# Patient Record
Sex: Female | Born: 1975 | Race: White | Hispanic: No | Marital: Married | Smoking: Former smoker
Health system: Southern US, Community
[De-identification: ages and names within clinical notes are randomized; demographics above are authoritative.]

## PROBLEM LIST (undated history)

## (undated) DIAGNOSIS — K08109 Complete loss of teeth, unspecified cause, unspecified class: Secondary | ICD-10-CM

## (undated) DIAGNOSIS — M25562 Pain in left knee: Secondary | ICD-10-CM

## (undated) DIAGNOSIS — Z862 Personal history of diseases of the blood and blood-forming organs and certain disorders involving the immune mechanism: Secondary | ICD-10-CM

## (undated) DIAGNOSIS — N926 Irregular menstruation, unspecified: Secondary | ICD-10-CM

## (undated) DIAGNOSIS — G8929 Other chronic pain: Secondary | ICD-10-CM

## (undated) DIAGNOSIS — R3915 Urgency of urination: Secondary | ICD-10-CM

## (undated) DIAGNOSIS — Z8709 Personal history of other diseases of the respiratory system: Secondary | ICD-10-CM

## (undated) DIAGNOSIS — Z972 Presence of dental prosthetic device (complete) (partial): Secondary | ICD-10-CM

## (undated) DIAGNOSIS — N946 Dysmenorrhea, unspecified: Secondary | ICD-10-CM

## (undated) DIAGNOSIS — N12 Tubulo-interstitial nephritis, not specified as acute or chronic: Secondary | ICD-10-CM

## (undated) DIAGNOSIS — M797 Fibromyalgia: Secondary | ICD-10-CM

## (undated) DIAGNOSIS — C50912 Malignant neoplasm of unspecified site of left female breast: Secondary | ICD-10-CM

## (undated) DIAGNOSIS — M5030 Other cervical disc degeneration, unspecified cervical region: Secondary | ICD-10-CM

## (undated) DIAGNOSIS — R51 Headache: Secondary | ICD-10-CM

## (undated) DIAGNOSIS — N6009 Solitary cyst of unspecified breast: Secondary | ICD-10-CM

## (undated) DIAGNOSIS — R202 Paresthesia of skin: Secondary | ICD-10-CM

## (undated) DIAGNOSIS — M549 Dorsalgia, unspecified: Secondary | ICD-10-CM

## (undated) DIAGNOSIS — B977 Papillomavirus as the cause of diseases classified elsewhere: Secondary | ICD-10-CM

## (undated) DIAGNOSIS — F909 Attention-deficit hyperactivity disorder, unspecified type: Secondary | ICD-10-CM

## (undated) DIAGNOSIS — M722 Plantar fascial fibromatosis: Secondary | ICD-10-CM

## (undated) DIAGNOSIS — F410 Panic disorder [episodic paroxysmal anxiety] without agoraphobia: Secondary | ICD-10-CM

## (undated) DIAGNOSIS — F419 Anxiety disorder, unspecified: Secondary | ICD-10-CM

## (undated) DIAGNOSIS — Z8659 Personal history of other mental and behavioral disorders: Secondary | ICD-10-CM

## (undated) DIAGNOSIS — K219 Gastro-esophageal reflux disease without esophagitis: Secondary | ICD-10-CM

## (undated) DIAGNOSIS — R519 Headache, unspecified: Secondary | ICD-10-CM

## (undated) HISTORY — DX: Paresthesia of skin: R20.2

## (undated) HISTORY — DX: Malignant neoplasm of unspecified site of left female breast: C50.912

## (undated) HISTORY — PX: DENTAL SURGERY: SHX609

## (undated) HISTORY — DX: Irregular menstruation, unspecified: N92.6

## (undated) HISTORY — DX: Dysmenorrhea, unspecified: N94.6

## (undated) HISTORY — PX: CRYOTHERAPY: SHX1416

## (undated) HISTORY — DX: Solitary cyst of unspecified breast: N60.09

## (undated) HISTORY — DX: Papillomavirus as the cause of diseases classified elsewhere: B97.7

## (undated) HISTORY — DX: Tubulo-interstitial nephritis, not specified as acute or chronic: N12

---
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
## 2000-01-04 ENCOUNTER — Emergency Department (HOSPITAL_COMMUNITY): Admission: EM | Admit: 2000-01-04 | Discharge: 2000-01-04 | Payer: Self-pay | Admitting: Emergency Medicine

## 2000-01-07 ENCOUNTER — Emergency Department (HOSPITAL_COMMUNITY): Admission: EM | Admit: 2000-01-07 | Discharge: 2000-01-07 | Payer: Self-pay | Admitting: Emergency Medicine

## 2000-01-08 ENCOUNTER — Encounter: Payer: Self-pay | Admitting: Emergency Medicine

## 2000-02-03 ENCOUNTER — Emergency Department (HOSPITAL_COMMUNITY): Admission: EM | Admit: 2000-02-03 | Discharge: 2000-02-04 | Payer: Self-pay | Admitting: Emergency Medicine

## 2000-04-06 ENCOUNTER — Emergency Department (HOSPITAL_COMMUNITY): Admission: EM | Admit: 2000-04-06 | Discharge: 2000-04-06 | Payer: Self-pay

## 2000-04-07 ENCOUNTER — Encounter: Admission: RE | Admit: 2000-04-07 | Discharge: 2000-04-07 | Payer: Self-pay

## 2000-04-11 ENCOUNTER — Emergency Department (HOSPITAL_COMMUNITY): Admission: EM | Admit: 2000-04-11 | Discharge: 2000-04-11 | Payer: Self-pay | Admitting: Internal Medicine

## 2000-06-08 ENCOUNTER — Emergency Department (HOSPITAL_COMMUNITY): Admission: EM | Admit: 2000-06-08 | Discharge: 2000-06-08 | Payer: Self-pay | Admitting: Emergency Medicine

## 2001-01-12 ENCOUNTER — Emergency Department (HOSPITAL_COMMUNITY): Admission: EM | Admit: 2001-01-12 | Discharge: 2001-01-12 | Payer: Self-pay | Admitting: Emergency Medicine

## 2001-01-18 ENCOUNTER — Emergency Department (HOSPITAL_COMMUNITY): Admission: EM | Admit: 2001-01-18 | Discharge: 2001-01-18 | Payer: Self-pay | Admitting: Emergency Medicine

## 2001-10-03 ENCOUNTER — Encounter: Admission: RE | Admit: 2001-10-03 | Discharge: 2001-10-03 | Payer: Self-pay

## 2001-10-09 ENCOUNTER — Emergency Department (HOSPITAL_COMMUNITY): Admission: EM | Admit: 2001-10-09 | Discharge: 2001-10-09 | Payer: Self-pay | Admitting: Emergency Medicine

## 2003-07-03 ENCOUNTER — Emergency Department (HOSPITAL_COMMUNITY): Admission: EM | Admit: 2003-07-03 | Discharge: 2003-07-03 | Payer: Self-pay | Admitting: Emergency Medicine

## 2003-07-09 ENCOUNTER — Emergency Department (HOSPITAL_COMMUNITY): Admission: EM | Admit: 2003-07-09 | Discharge: 2003-07-09 | Payer: Self-pay | Admitting: Emergency Medicine

## 2003-09-10 ENCOUNTER — Emergency Department (HOSPITAL_COMMUNITY): Admission: EM | Admit: 2003-09-10 | Discharge: 2003-09-10 | Payer: Self-pay | Admitting: Emergency Medicine

## 2003-10-28 ENCOUNTER — Emergency Department (HOSPITAL_COMMUNITY): Admission: EM | Admit: 2003-10-28 | Discharge: 2003-10-28 | Payer: Self-pay | Admitting: Emergency Medicine

## 2003-10-30 ENCOUNTER — Emergency Department (HOSPITAL_COMMUNITY): Admission: EM | Admit: 2003-10-30 | Discharge: 2003-10-30 | Payer: Self-pay | Admitting: *Deleted

## 2003-11-21 ENCOUNTER — Emergency Department (HOSPITAL_COMMUNITY): Admission: EM | Admit: 2003-11-21 | Discharge: 2003-11-21 | Payer: Self-pay | Admitting: Emergency Medicine

## 2004-01-18 ENCOUNTER — Emergency Department (HOSPITAL_COMMUNITY): Admission: EM | Admit: 2004-01-18 | Discharge: 2004-01-19 | Payer: Self-pay | Admitting: Emergency Medicine

## 2004-04-10 ENCOUNTER — Emergency Department (HOSPITAL_COMMUNITY): Admission: EM | Admit: 2004-04-10 | Discharge: 2004-04-10 | Payer: Self-pay | Admitting: Emergency Medicine

## 2004-05-05 ENCOUNTER — Inpatient Hospital Stay (HOSPITAL_COMMUNITY): Admission: AD | Admit: 2004-05-05 | Discharge: 2004-05-05 | Payer: Self-pay | Admitting: Obstetrics and Gynecology

## 2004-05-25 ENCOUNTER — Emergency Department (HOSPITAL_COMMUNITY): Admission: EM | Admit: 2004-05-25 | Discharge: 2004-05-25 | Payer: Self-pay | Admitting: Emergency Medicine

## 2004-06-17 ENCOUNTER — Emergency Department (HOSPITAL_COMMUNITY): Admission: EM | Admit: 2004-06-17 | Discharge: 2004-06-17 | Payer: Self-pay | Admitting: Emergency Medicine

## 2004-07-13 ENCOUNTER — Emergency Department (HOSPITAL_COMMUNITY): Admission: EM | Admit: 2004-07-13 | Discharge: 2004-07-13 | Payer: Self-pay | Admitting: Emergency Medicine

## 2004-09-16 ENCOUNTER — Emergency Department (HOSPITAL_COMMUNITY): Admission: EM | Admit: 2004-09-16 | Discharge: 2004-09-16 | Payer: Self-pay | Admitting: Emergency Medicine

## 2004-10-17 ENCOUNTER — Emergency Department (HOSPITAL_COMMUNITY): Admission: EM | Admit: 2004-10-17 | Discharge: 2004-10-17 | Payer: Self-pay | Admitting: Emergency Medicine

## 2004-10-18 ENCOUNTER — Emergency Department (HOSPITAL_COMMUNITY): Admission: EM | Admit: 2004-10-18 | Discharge: 2004-10-18 | Payer: Self-pay | Admitting: Emergency Medicine

## 2004-12-03 ENCOUNTER — Emergency Department (HOSPITAL_COMMUNITY): Admission: EM | Admit: 2004-12-03 | Discharge: 2004-12-03 | Payer: Self-pay | Admitting: Emergency Medicine

## 2005-01-05 ENCOUNTER — Emergency Department (HOSPITAL_COMMUNITY): Admission: EM | Admit: 2005-01-05 | Discharge: 2005-01-06 | Payer: Self-pay | Admitting: Emergency Medicine

## 2005-01-14 ENCOUNTER — Emergency Department (HOSPITAL_COMMUNITY): Admission: EM | Admit: 2005-01-14 | Discharge: 2005-01-14 | Payer: Self-pay | Admitting: Emergency Medicine

## 2005-01-15 ENCOUNTER — Inpatient Hospital Stay (HOSPITAL_COMMUNITY): Admission: AD | Admit: 2005-01-15 | Discharge: 2005-01-15 | Payer: Self-pay | Admitting: Family Medicine

## 2005-01-17 ENCOUNTER — Inpatient Hospital Stay (HOSPITAL_COMMUNITY): Admission: AD | Admit: 2005-01-17 | Discharge: 2005-01-17 | Payer: Self-pay | Admitting: Obstetrics & Gynecology

## 2005-01-24 ENCOUNTER — Inpatient Hospital Stay (HOSPITAL_COMMUNITY): Admission: AD | Admit: 2005-01-24 | Discharge: 2005-01-24 | Payer: Self-pay | Admitting: Obstetrics and Gynecology

## 2005-01-25 ENCOUNTER — Inpatient Hospital Stay (HOSPITAL_COMMUNITY): Admission: AD | Admit: 2005-01-25 | Discharge: 2005-01-25 | Payer: Self-pay | Admitting: *Deleted

## 2005-01-26 ENCOUNTER — Inpatient Hospital Stay (HOSPITAL_COMMUNITY): Admission: AD | Admit: 2005-01-26 | Discharge: 2005-01-26 | Payer: Self-pay | Admitting: Obstetrics and Gynecology

## 2005-01-28 ENCOUNTER — Ambulatory Visit (HOSPITAL_COMMUNITY): Admission: RE | Admit: 2005-01-28 | Discharge: 2005-01-28 | Payer: Self-pay | Admitting: Obstetrics and Gynecology

## 2005-03-04 ENCOUNTER — Other Ambulatory Visit: Admission: RE | Admit: 2005-03-04 | Discharge: 2005-03-04 | Payer: Self-pay | Admitting: Obstetrics and Gynecology

## 2005-03-18 ENCOUNTER — Encounter: Admission: RE | Admit: 2005-03-18 | Discharge: 2005-03-18 | Payer: Self-pay | Admitting: Obstetrics and Gynecology

## 2005-04-18 ENCOUNTER — Emergency Department (HOSPITAL_COMMUNITY): Admission: EM | Admit: 2005-04-18 | Discharge: 2005-04-18 | Payer: Self-pay | Admitting: Emergency Medicine

## 2005-05-26 ENCOUNTER — Inpatient Hospital Stay (HOSPITAL_COMMUNITY): Admission: AD | Admit: 2005-05-26 | Discharge: 2005-05-26 | Payer: Self-pay | Admitting: Obstetrics and Gynecology

## 2005-09-29 ENCOUNTER — Inpatient Hospital Stay (HOSPITAL_COMMUNITY): Admission: AD | Admit: 2005-09-29 | Discharge: 2005-10-01 | Payer: Self-pay | Admitting: Obstetrics and Gynecology

## 2005-12-07 ENCOUNTER — Emergency Department (HOSPITAL_COMMUNITY): Admission: EM | Admit: 2005-12-07 | Discharge: 2005-12-07 | Payer: Self-pay | Admitting: Emergency Medicine

## 2006-11-09 ENCOUNTER — Emergency Department (HOSPITAL_COMMUNITY): Admission: EM | Admit: 2006-11-09 | Discharge: 2006-11-09 | Payer: Self-pay | Admitting: Emergency Medicine

## 2006-12-03 ENCOUNTER — Emergency Department (HOSPITAL_COMMUNITY): Admission: EM | Admit: 2006-12-03 | Discharge: 2006-12-03 | Payer: Self-pay | Admitting: Emergency Medicine

## 2007-01-08 ENCOUNTER — Emergency Department (HOSPITAL_COMMUNITY): Admission: EM | Admit: 2007-01-08 | Discharge: 2007-01-08 | Payer: Self-pay | Admitting: Emergency Medicine

## 2008-09-26 ENCOUNTER — Emergency Department (HOSPITAL_COMMUNITY): Admission: EM | Admit: 2008-09-26 | Discharge: 2008-09-27 | Payer: Self-pay | Admitting: Emergency Medicine

## 2009-10-25 ENCOUNTER — Emergency Department (HOSPITAL_BASED_OUTPATIENT_CLINIC_OR_DEPARTMENT_OTHER): Admission: EM | Admit: 2009-10-25 | Discharge: 2009-10-25 | Payer: Self-pay | Admitting: Emergency Medicine

## 2009-10-25 ENCOUNTER — Ambulatory Visit: Payer: Self-pay | Admitting: Diagnostic Radiology
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
## 2011-01-14 ENCOUNTER — Encounter (HOSPITAL_COMMUNITY): Payer: Self-pay | Admitting: *Deleted

## 2011-01-14 ENCOUNTER — Inpatient Hospital Stay (HOSPITAL_COMMUNITY)
Admission: AD | Admit: 2011-01-14 | Discharge: 2011-01-14 | Disposition: A | Discharge status: Home / Self Care | Payer: Self-pay | Source: Ambulatory Visit | Attending: Obstetrics and Gynecology | Admitting: Obstetrics and Gynecology

## 2011-01-14 DIAGNOSIS — R109 Unspecified abdominal pain: Secondary | ICD-10-CM | POA: Insufficient documentation

## 2011-01-14 DIAGNOSIS — N3090 Cystitis, unspecified without hematuria: Secondary | ICD-10-CM | POA: Insufficient documentation

## 2011-01-14 LAB — URINALYSIS, ROUTINE W REFLEX MICROSCOPIC
Glucose, UA: NEGATIVE mg/dL
Ketones, ur: NEGATIVE mg/dL
Protein, ur: NEGATIVE mg/dL
Specific Gravity, Urine: 1.015 (ref 1.005–1.030)
Urobilinogen, UA: 0.2 mg/dL (ref 0.0–1.0)
pH: 6 (ref 5.0–8.0)

## 2011-01-14 LAB — URINE MICROSCOPIC-ADD ON

## 2011-01-14 LAB — POCT PREGNANCY, URINE: Preg Test, Ur: NEGATIVE

## 2011-01-14 NOTE — Progress Notes (Signed)
Pt states, "At 1 pm I started having burning when I pee and I feel like I am not emptying my bladder, like I have to go right back.I have a little bit of feeling uncomfortable in my low abdomen."

## 2011-01-14 NOTE — ED Provider Notes (Signed)
History    35 y/o Female presents to MAU with CC "urinary frequency, dysuria and pressure in abd." started yesterday but has increased in intensity throughout day. Denies Hematuria, but c/o of strong "urine odor". Was @ [place] on vacation last week and had menses, used tampon to swim in ocean and feels this may have been contributing factor. Took 4 cranberry extract pills today, 2 garlic pills and "drank a cup of pickle juice" also trying to push po fluids throughout day. Hx of UTI approx 6 yrs ago, states feels similar. Treated with Macrobid and Pyridium with previous UTI and had allergic rxn, but unsure to which.  Chief Complaint  Patient presents with  . Dysuria   HPI  Pertinent Gynecological History: Menses: flow is light, usually lasting less than 6 days and with minimal cramping Bleeding: none Contraception: condoms DES exposure: denies Blood transfusions: none Sexually transmitted diseases: no past history Previous GYN Procedures: none  Last mammogram: 35 y/o Date: n/a Last pap: normal Date: over a year ago, pt notes had scheduled appt. For Pap next week   Past Medical History  Diagnosis Date  . No pertinent past medical history     Past Surgical History  Procedure Date  . Dental surgery     No family history on file.  History  Substance Use Topics  . Smoking status: Never Smoker   . Smokeless tobacco: Not on file  . Alcohol Use: No    Allergies:  Allergies  Allergen Reactions  . Darvocet (Propoxyphene N-Acetaminophen) Nausea And Vomiting  . Erythromycin Nausea And Vomiting    childhood  . Macrobid Hives  . Pyridium (Phenazopyridine Hcl) Hives  . Zithromax (Azithromycin Dihydrate) Nausea And Vomiting    No prescriptions prior to admission    Review of Systems  Constitutional: Positive for chills. Negative for fever, weight loss, malaise/fatigue and diaphoresis.  HENT: Negative.   Eyes: Negative.   Respiratory: Negative.   Cardiovascular: Negative.     Gastrointestinal: Negative.   Genitourinary: Positive for dysuria, urgency and frequency. Negative for hematuria and flank pain.  Musculoskeletal: Negative.   Skin: Positive for itching. Rash: Has healing sunburn from vacation.  Neurological: Negative.  Negative for weakness.  Endo/Heme/Allergies: Negative.   Psychiatric/Behavioral: Negative.    Physical Exam   Last menstrual period 01/07/2011.  Physical Exam  Constitutional: She is oriented to person, place, and time. She appears well-developed and well-nourished. No distress.  HENT:  Head: Normocephalic.  Neck: Normal range of motion.  Cardiovascular: Normal rate.   Respiratory: Effort normal.  GI: Soft. She exhibits no distension and no mass. There is tenderness (tenderness over bladder area). There is no rebound and no guarding.  Genitourinary:       Pelvic deferred  Musculoskeletal: Normal range of motion.  Neurological: She is alert and oriented to person, place, and time.  Skin: Skin is warm and dry. No rash noted. She is not diaphoretic. There is erythema (sunburn). No pallor.  Psychiatric: She has a normal mood and affect. Her behavior is normal. Judgment and thought content normal.    MAU Course  Procedures Results for SAIRAH, KNOBLOCH (MRN [id number]) as of 01/14/2011 20:46  Ref. Range 01/14/2011 20:10  Color, Urine Latest Range: YELLOW  YELLOW  Appearance Latest Range: CLEAR  HAZY (A)  Specific Gravity, Urine Latest Range: 1.005-1.030  1.015  pH Latest Range: 5.0-8.0  6.0  Glucose, UA Latest Range: NEGATIVE mg/dL NEGATIVE  Bilirubin Urine Latest Range: NEGATIVE  NEGATIVE  Ketones, ur Latest  Range: NEGATIVE mg/dL NEGATIVE  Protein Latest Range: NEGATIVE mg/dL NEGATIVE  Urobilinogen, UA Latest Range: 0.0-1.0 mg/dL 0.2  Nitrite Latest Range: NEGATIVE  NEGATIVE  Leukocytes, UA Latest Range: NEGATIVE  LARGE (A)  WBC, UA Latest Range: <3 WBC/hpf TOO NUMEROUS TO COUNT  RBC / HPF Latest Range: <3 RBC/hpf 21-50  Squamous  Epithelial / LPF Latest Range: RARE  FEW (A)  Bacteria, UA Latest Range: RARE  MANY (A)  Hgb urine dipstick Latest Range: NEGATIVE  LARGE (A)   MDM   Assessment and Plan  1. Cystitis Plan: Bactrim DS BID x 7days Tylenol prn UA C&S D/C home  Anabel Halon 01/14/2011, 8:37 PM

## 2011-01-14 NOTE — Progress Notes (Signed)
Pt in c/o burning with urination and feeling like inability to empty bladder.  Reports lower mid abdominal discomfort.  Denies any bleeding or abnormal discharge.

## 2011-05-08 ENCOUNTER — Encounter (HOSPITAL_COMMUNITY): Payer: Self-pay | Admitting: Obstetrics and Gynecology

## 2011-05-08 ENCOUNTER — Inpatient Hospital Stay (HOSPITAL_COMMUNITY)
Admission: AD | Admit: 2011-05-08 | Discharge: 2011-05-08 | Disposition: A | Discharge status: Home / Self Care | Payer: Self-pay | Source: Ambulatory Visit | Attending: Obstetrics and Gynecology | Admitting: Obstetrics and Gynecology

## 2011-05-08 DIAGNOSIS — R109 Unspecified abdominal pain: Secondary | ICD-10-CM | POA: Insufficient documentation

## 2011-05-08 LAB — URINALYSIS, ROUTINE W REFLEX MICROSCOPIC
Bilirubin Urine: NEGATIVE
Ketones, ur: NEGATIVE mg/dL
Nitrite: NEGATIVE
Specific Gravity, Urine: 1.015 (ref 1.005–1.030)
pH: 7.5 (ref 5.0–8.0)

## 2011-05-08 LAB — URINE MICROSCOPIC-ADD ON

## 2011-05-08 MED ORDER — IBUPROFEN 800 MG PO TABS
800.0000 mg | ORAL_TABLET | Freq: Once | ORAL | Status: AC
  Administered 2011-05-08: 800 mg via ORAL
  Filled 2011-05-08: qty 1

## 2011-05-08 NOTE — Progress Notes (Signed)
Pt presents to MAU with chief complaint of "abdominal cramping". Pain started last week; mild period cramps. LMP Nov. 21, 2012. No hx of abdominal cramping with menstrual cycle; new pain for her. Unsure if UTI, pregnancy

## 2011-05-08 NOTE — ED Provider Notes (Signed)
History     CSN: [id number] Arrival date & time: 05/08/2011 10:32 AM   None     Chief Complaint  Patient presents with  . Abdominal Cramping     HPI Comments: Pt arrived with CC of lower abdominal cramping with onset this AM. She denies any VB or D/C. No N/V/D. She has not tried any treatments at home. Reports LMP of 11/20, States she has regular periods, has not had any dysmenorrhea.   Abdominal Cramping This is a new problem. The current episode started today. The onset quality is gradual. The problem has been unchanged. The pain is located in the suprapubic region, LLQ and RLQ. The pain is at a severity of 3/10. The pain is mild. The quality of the pain is cramping. The abdominal pain does not radiate. The pain is aggravated by nothing. The pain is relieved by nothing. She has tried nothing for the symptoms.    Past Medical History  Diagnosis Date  . No pertinent past medical history     Past Surgical History  Procedure Date  . Dental surgery   . No past surgeries     Family History  Problem Relation Age of Onset  . Hypertension Father   . COPD Father     History  Substance Use Topics  . Smoking status: Never Smoker   . Smokeless tobacco: Not on file  . Alcohol Use: No    OB History    Grav Para Term Preterm Abortions TAB SAB Ect Mult Living   2 2 2  0 0 0 0 0 0 2      Review of Systems  Gastrointestinal: Positive for abdominal pain.  Psychiatric/Behavioral: The patient is nervous/anxious.   All other systems reviewed and are negative.    Allergies  Darvocet; Erythromycin; Macrobid; Pyridium; and Zithromax  Home Medications  No current outpatient prescriptions on file.  BP 113/72  Pulse 95  Temp 98.4 F (36.9 C)  Resp 18  LMP 04/13/2011  Physical Exam  Nursing note and vitals reviewed. Constitutional: She is oriented to person, place, and time. She appears well-developed and well-nourished.  Neck: Normal range of motion.  Cardiovascular:  Normal rate.   Pulmonary/Chest: Effort normal.  Abdominal: Soft. She exhibits no distension and no mass. There is no tenderness. There is no rebound and no guarding.  Musculoskeletal: Normal range of motion.  Neurological: She is alert and oriented to person, place, and time.  Skin: Skin is warm and dry.  Psychiatric: She has a normal mood and affect. Her behavior is normal. Judgment and thought content normal.    Pelvic exam deferred   ED Course  Procedures (including critical care time)  Labs Reviewed  URINALYSIS, ROUTINE W REFLEX MICROSCOPIC - Abnormal; Notable for the following:    Leukocytes, UA SMALL (*)    All other components within normal limits  URINE MICROSCOPIC-ADD ON - Abnormal; Notable for the following:    Squamous Epithelial / LPF MANY (*) MANY   Bacteria, UA FEW (*) FEW   All other components within normal limits  POCT PREGNANCY, URINE  POCT PREGNANCY, URINE   No results found.   No diagnosis found.    MDM  A: likely premenstrual cramping Neg abdominal exam Pain improved with PO motrin Few bacteria in urine, no c/o dysuria  P: will send urine for cx  D/C home CTO for worsening sx's and f/u [facility] for pap and further work up if necessary

## 2011-08-01 ENCOUNTER — Emergency Department (INDEPENDENT_AMBULATORY_CARE_PROVIDER_SITE_OTHER): Payer: Self-pay

## 2011-08-01 ENCOUNTER — Emergency Department (HOSPITAL_BASED_OUTPATIENT_CLINIC_OR_DEPARTMENT_OTHER)
Admission: EM | Admit: 2011-08-01 | Discharge: 2011-08-01 | Disposition: A | Discharge status: Home / Self Care | Payer: Self-pay | Attending: Emergency Medicine | Admitting: Emergency Medicine

## 2011-08-01 ENCOUNTER — Encounter (HOSPITAL_BASED_OUTPATIENT_CLINIC_OR_DEPARTMENT_OTHER): Payer: Self-pay

## 2011-08-01 DIAGNOSIS — IMO0002 Reserved for concepts with insufficient information to code with codable children: Secondary | ICD-10-CM | POA: Insufficient documentation

## 2011-08-01 DIAGNOSIS — M7989 Other specified soft tissue disorders: Secondary | ICD-10-CM | POA: Insufficient documentation

## 2011-08-01 DIAGNOSIS — Z043 Encounter for examination and observation following other accident: Secondary | ICD-10-CM

## 2011-08-01 DIAGNOSIS — X500XXA Overexertion from strenuous movement or load, initial encounter: Secondary | ICD-10-CM

## 2011-08-01 DIAGNOSIS — Y92009 Unspecified place in unspecified non-institutional (private) residence as the place of occurrence of the external cause: Secondary | ICD-10-CM | POA: Insufficient documentation

## 2011-08-01 DIAGNOSIS — M79609 Pain in unspecified limb: Secondary | ICD-10-CM | POA: Insufficient documentation

## 2011-08-01 DIAGNOSIS — M25549 Pain in joints of unspecified hand: Secondary | ICD-10-CM | POA: Insufficient documentation

## 2011-08-01 DIAGNOSIS — S6990XA Unspecified injury of unspecified wrist, hand and finger(s), initial encounter: Secondary | ICD-10-CM | POA: Insufficient documentation

## 2011-08-01 DIAGNOSIS — S60229A Contusion of unspecified hand, initial encounter: Secondary | ICD-10-CM | POA: Insufficient documentation

## 2011-08-01 MED ORDER — OXYCODONE-ACETAMINOPHEN 5-325 MG PO TABS: 1.0000 | ORAL_TABLET | Freq: Four times a day (QID) | ORAL | Status: AC | PRN

## 2011-08-01 NOTE — ED Notes (Signed)
Right hand caught between door and dresser 5 days ago

## 2011-08-01 NOTE — Discharge Instructions (Signed)
Contusion (Bruise) of Hand  An injury to the hand may cause bruises (contusions). Contusions are caused by bleeding from small blood vessels (capillaries) that allow blood to leak out into the muscles, tendons, and surrounding soft tissue. This is followed by swelling and pain (inflammation). Contusions of the hand are common because of the use of hands in daily and recreational activities. Signs of a hand injury include pain, swelling, and a color change. Initially the skin may turn blue to purple in color. As the bruise ages, the color turns yellow and orange. Swelling may decrease the movement of the fingers. Contusions are seen more commonly with:   Contact sports (especially in football, wrestling, and basketball).   Use of medications that thin the blood (anticoagulants).   Use of aspirin and nonsteroidal anti-inflammatory agents that decrease the ability of the blood to clot.   Vitamin deficiencies.   Aging.  DIAGNOSIS   Diagnosis of hand injuries can be made by your own observation. If problems continue, a caregiver may be required for further evaluation and treatment. X-rays may be required to make sure there are no broken bones (fractures). Continued problems may require physical therapy for treatment.  RISKS AND COMPLICATIONS   Extensive bleeding and tissue inflammation. This can lead to disability and arthritis-type problems later on if the hand does not heal properly.   Infection of the hand if there are breaks in the skin. This is especially true if the hand injury came from someone's teeth, such as would occur with punching someone in the mouth. This can lead to an infection of the tendons and the membranes surrounding the tendons (sheaths). This infection can have severe complications including a loss of function (a "frozen" hand).   Rupture of the tendons requiring a surgical repair. Failure to repair the tendons can result in loss of function of the hand or fingers.  HOME CARE INSTRUCTIONS     Apply ice to the injury for 15 to 20 minutes, 3 to 4 times per day. Put the ice in a plastic bag and place a towel between the bag of ice and your skin.   An elastic bandage may be used initially for support and to minimize swelling. Do not wrap the hand too tightly. Do not sleep with the elastic bandage on.   Gentle massage from the fingertips towards the elbow will help keep the swelling down. Gently open and close your fist while doing this to maintain range of motion. Do this only after the first few days, when there is no or minimal pain.   Keep your hand above the level of the heart when swelling and pain are present. This will allow the fluid to drain out of the hand, decreasing the amount of swelling. This will improve healing time.   Try to avoid use of the injured hand (except for gentle range of motion) while the hand is hurting. Do not resume use until instructed by your caregiver. Then begin use gradually, do not increase use to the point of pain. If pain does develop, decrease use and continue the above measures, gradually increasing activities that do not cause discomfort until you achieve normal use.   Only take over-the-counter or prescription medicines for pain, discomfort, or fever as directed by your caregiver.   Follow up with your caregiver as directed. Follow-up care may include orthopedic referrals, physical therapy, and rehabilitation. Any delay in obtaining necessary care could result in delayed healing, or temporary or permanent disability.  REHABILITATION     an elastic bandage is no longer needed and you are either pain free or only have minimal pain.   Use ice massage for 10 minutes before and after workouts. Put ice in a plastic bag and place a towel between the bag of ice and your skin. Massage the injured area with the ice pack.  SEEK IMMEDIATE MEDICAL CARE IF:   Your pain and swelling increase, or pain is  uncontrolled with medications.   You have loss of feeling in your hand, or your hand turns cold or blue.   An oral temperature above 102 F (38.9 C) develops, not controlled by medication.   Your hand becomes warm to the touch, or you have increased pain with even slight movement of your fingers.   Your hand does not begin to improve in 1 or 2 days.   The skin is broken and signs of infection occur (fluid draining from the contusion, increasing pain, fever, headache, muscle aches, dizziness, or a general ill feeling).   You develop new, unexplained problems, or an increase of the symptoms that brought you to your caregiver.  MAKE SURE YOU:   Understand these instructions.   Will watch your condition.   Will get help right away if you are not doing well or get worse.  Document Released: 10/29/2001 Document Revised: 04/28/2011 Document Reviewed: 10/16/2009 ExitCare Patient Information 2012 ExitCare, [facility]  Elastic Bandage You have been given an elastic bandage to make you more comfortable and to lessen swelling. HOME CARE INSTRUCTIONS How to apply:  Hold the roll in one hand with the loose end in the other hand. Place the outside of the loose end on the patient's hand or foot.   With your working hand, pass the roll around the patient's hand or foot 2 or 3 times. Wrap so that it is snug, but do not pull on the wrap. It should be not too tight and not too loose.   Make a "figure eight" turn (like an "x") around the wrist or ankle.   Finish the wrap away from the wrist or ankle so the clips will not be over bony areas. Fasten the end with the clips or tape.  The following problems may mean that the elastic bandage is too tight. You need to remove the bandage and put it on again, less tightly.  Increased swelling or pain occurs.   Fingers or toes become pale, bluish, or cold.   The fingers or toes become numb or tingling.   There is loss of movement in the injured hand or foot.   Only take medicine as directed by your caregiver. SEEK IMMEDIATE MEDICAL CARE IF:   Any of the above problems are not gone 30 minutes after you rewrap the bandage.   You have other questions or concerns.  MAKE SURE YOU:   Understand these instructions.   Will watch your condition.   Will get help right away if you are not doing well or get worse.  Document Released: 01/13/2004 Document Revised: 04/28/2011 Document Reviewed: 09/04/2007 [facility] Patient Information 2012 [place], [place].Marland Kitchen

## 2011-08-01 NOTE — ED Notes (Signed)
NP at bedside.

## 2011-08-01 NOTE — ED Provider Notes (Signed)
History     CSN: [id number]  Arrival date & time 08/01/11  2049   First MD Initiated Contact with Patient 08/01/11 2214      Chief Complaint  Patient presents with  . Hand Injury    (Consider location/radiation/quality/duration/timing/severity/associated sxs/prior treatment) Patient is a 36 y.o. female presenting with hand injury. The history is provided by the patient. No language interpreter was used.  Hand Injury  The incident occurred more than 2 days ago. The incident occurred at home. The injury mechanism was a direct blow. The pain is present in the right hand. The quality of the pain is described as throbbing. The pain is at a severity of 5/10. The pain is moderate. The pain has been fluctuating since the incident. She reports no foreign bodies present. The symptoms are aggravated by use. She has tried NSAIDs for the symptoms. The treatment provided no relief.  Patient states she struck her hand on the knob of the door 5 days ago--initially treated with heat rather than ice.  Persistent pain since injury not responding to ibuprofen.  History reviewed. No pertinent past medical history.  Past Surgical History  Procedure Date  . Dental surgery     Family History  Problem Relation Age of Onset  . Hypertension Father   . COPD Father     History  Substance Use Topics  . Smoking status: Never Smoker   . Smokeless tobacco: Not on file  . Alcohol Use: No    OB History    Grav Para Term Preterm Abortions TAB SAB Ect Mult Living   2 2 2  0 0 0 0 0 0 2      Review of Systems  Musculoskeletal: Positive for arthralgias.  All other systems reviewed and are negative.    Allergies  Darvocet; Erythromycin; Macrobid; Pyridium; and Zithromax  Home Medications  No current outpatient prescriptions on file.  BP 126/87  Pulse 80  Temp(Src) 98.4 F (36.9 C) (Oral)  Resp 20  Ht 5\' 1"  (1.549 m)  Wt 138 lb (62.596 kg)  BMI 26.07 kg/m2  SpO2 100%  LMP  07/19/2011  Physical Exam  Nursing note and vitals reviewed. Constitutional: She is oriented to person, place, and time. She appears well-developed and well-nourished.  HENT:  Head: Normocephalic and atraumatic.  Mouth/Throat: Oropharynx is clear and moist.  Eyes: Conjunctivae are normal. Pupils are equal, round, and reactive to light.  Neck: Normal range of motion. Neck supple.  Cardiovascular: Normal rate, regular rhythm, normal heart sounds and intact distal pulses.   Pulmonary/Chest: Effort normal and breath sounds normal.  Abdominal: Soft. Bowel sounds are normal.  Musculoskeletal: Normal range of motion.       Right hand: She exhibits tenderness and swelling. She exhibits normal capillary refill, no deformity and no laceration. normal sensation noted. Normal strength noted.       Hands: Neurological: She is alert and oriented to person, place, and time.  Skin: Skin is warm and dry.  Psychiatric: She has a normal mood and affect. Her behavior is normal. Judgment and thought content normal.    ED Course  Procedures (including critical care time)  Labs Reviewed - No data to display Dg Hand Complete Right  08/01/2011  *RADIOLOGY REPORT*  Clinical Data: Twisted hand 4 days ago  RIGHT HAND - COMPLETE 3+ VIEW  Comparison: None.  Findings: Three views of the right hand submitted.  No acute fracture or subluxation.  No radiopaque foreign body.  IMPRESSION: No acute fracture  or subluxation.  Original Report Authenticated By: Natasha Mead, M.D.     No diagnosis found.  Contusion of right hand.  MDM          Jimmye Norman, NP 08/01/11 2232

## 2011-08-02 NOTE — ED Provider Notes (Signed)
Medical screening examination/treatment/procedure(s) were performed by non-physician practitioner and as supervising physician I was immediately available for consultation/collaboration.   Joya Gaskins, MD 08/02/11 Jacinta Shoe

## 2011-09-15 ENCOUNTER — Other Ambulatory Visit (HOSPITAL_COMMUNITY)
Admission: RE | Admit: 2011-09-15 | Discharge: 2011-09-15 | Disposition: A | Discharge status: Home / Self Care | Payer: Self-pay | Source: Ambulatory Visit | Attending: Family Medicine | Admitting: Family Medicine

## 2011-09-15 ENCOUNTER — Other Ambulatory Visit: Payer: Self-pay | Admitting: Family Medicine

## 2011-09-15 DIAGNOSIS — Z124 Encounter for screening for malignant neoplasm of cervix: Secondary | ICD-10-CM | POA: Insufficient documentation

## 2011-09-16 ENCOUNTER — Encounter (HOSPITAL_BASED_OUTPATIENT_CLINIC_OR_DEPARTMENT_OTHER): Payer: Self-pay | Admitting: *Deleted

## 2011-09-16 ENCOUNTER — Emergency Department (HOSPITAL_BASED_OUTPATIENT_CLINIC_OR_DEPARTMENT_OTHER)
Admission: EM | Admit: 2011-09-16 | Discharge: 2011-09-17 | Disposition: A | Discharge status: Home / Self Care | Payer: Self-pay | Attending: Emergency Medicine | Admitting: Emergency Medicine

## 2011-09-16 DIAGNOSIS — J3489 Other specified disorders of nose and nasal sinuses: Secondary | ICD-10-CM | POA: Insufficient documentation

## 2011-09-16 DIAGNOSIS — J029 Acute pharyngitis, unspecified: Secondary | ICD-10-CM | POA: Insufficient documentation

## 2011-09-16 MED ORDER — AMOXICILLIN 500 MG PO CAPS: 500.0000 mg | ORAL_CAPSULE | Freq: Once | ORAL | Status: DC

## 2011-09-16 MED ORDER — PENICILLIN G BENZATHINE 1200000 UNIT/2ML IM SUSP
1.2000 10*6.[IU] | Freq: Once | INTRAMUSCULAR | Status: AC
  Administered 2011-09-16: 1.2 10*6.[IU] via INTRAMUSCULAR
  Filled 2011-09-16: qty 2

## 2011-09-16 MED ORDER — AMOXICILLIN 500 MG PO CAPS: 500.0000 mg | ORAL_CAPSULE | Freq: Three times a day (TID) | ORAL | Status: AC

## 2011-09-16 MED ORDER — AMOXICILLIN 500 MG PO CAPS
500.0000 mg | ORAL_CAPSULE | Freq: Once | ORAL | Status: AC
  Administered 2011-09-16: 500 mg via ORAL
  Filled 2011-09-16: qty 1

## 2011-09-16 NOTE — ED Notes (Signed)
Sore throat, cough, and soreness all over. Took musinex with some relief.

## 2011-09-16 NOTE — ED Notes (Signed)
Pt sts throat is itchy and sore

## 2011-09-16 NOTE — ED Notes (Signed)
Saw her MD yesterday and was told her throat was a little red.

## 2011-09-16 NOTE — Discharge Instructions (Signed)
Pharyngitis, Viral and Bacterial  Pharyngitis is soreness (inflammation) or infection of the pharynx. It is also called a sore throat.  CAUSES   Most sore throats are caused by viruses and are part of a cold. However, some sore throats are caused by strep and other bacteria. Sore throats can also be caused by post nasal drip from draining sinuses, allergies and sometimes from sleeping with an open mouth. Infectious sore throats can be spread from person to person by coughing, sneezing and sharing cups or eating utensils.  TREATMENT   Sore throats that are viral usually last 3-4 days. Viral illness will get better without medications (antibiotics). Strep throat and other bacterial infections will usually begin to get better about 24-48 hours after you begin to take antibiotics.  HOME CARE INSTRUCTIONS   · If the caregiver feels there is a bacterial infection or if there is a positive strep test, they will prescribe an antibiotic. The full course of antibiotics must be taken. If the full course of antibiotic is not taken, you or your child may become ill again. If you or your child has strep throat and do not finish all of the medication, serious heart or kidney diseases may develop.  · Drink enough water and fluids to keep your urine clear or pale yellow.  · Only take over-the-counter or prescription medicines for pain, discomfort or fever as directed by your caregiver.  · Get lots of rest.  · Gargle with salt water (½ tsp. of salt in a glass of water) as often as every 1-2 hours as you need for comfort.  · Hard candies may soothe the throat if individual is not at risk for choking. Throat sprays or lozenges may also be used.  SEEK MEDICAL CARE IF:   · Large, tender lumps in the neck develop.  · A rash develops.  · Green, yellow-brown or bloody sputum is coughed up.  · Your baby is older than 3 months with a rectal temperature of 100.5° F (38.1° C) or higher for more than 1 day.  SEEK IMMEDIATE MEDICAL CARE IF:   · A  stiff neck develops.  · You or your child are drooling or unable to swallow liquids.  · You or your child are vomiting, unable to keep medications or liquids down.  · You or your child has severe pain, unrelieved with recommended medications.  · You or your child are having difficulty breathing (not due to stuffy nose).  · You or your child are unable to fully open your mouth.  · You or your child develop redness, swelling, or severe pain anywhere on the neck.  · You have a fever.  · Your baby is older than 3 months with a rectal temperature of 102° F (38.9° C) or higher.  · Your baby is 3 months old or younger with a rectal temperature of 100.4° F (38° C) or higher.  MAKE SURE YOU:   · Understand these instructions.  · Will watch your condition.  · Will get help right away if you are not doing well or get worse.  Document Released: 05/09/2005 Document Revised: 04/28/2011 Document Reviewed: 08/06/2007  ExitCare® Patient Information ©2012 [employer].  Salt Water Gargle  This solution will help make your mouth and throat feel better.  HOME CARE INSTRUCTIONS   · Mix 1 teaspoon of salt in 8 ounces of warm water.  · Gargle with this solution as much or often as you need or as directed. Swish and   gargle gently if you have any sores or wounds in your mouth.  · Do not swallow this mixture.  Document Released: 02/11/2004 Document Revised: 04/28/2011 Document Reviewed: 07/04/2008  ExitCare® Patient Information ©2012 [employer].

## 2011-09-17 NOTE — ED Provider Notes (Signed)
History     CSN: [id number]  Arrival date & time 09/16/11  2124   First MD Initiated Contact with Patient 09/16/11 2201      No chief complaint on file.   (Consider location/radiation/quality/duration/timing/severity/associated sxs/prior treatment) HPI Patient is a 36 yo female who presents today complaining of 2 days of sore throat as well as nasal congestion and postnasal drip. Patient has been exposed to 2 of her children who both have been diagnosed and treated for strep throat. She denies any fevers and says that she has not had any significant cough. She was worried she might have strep throat or be developing a bronchitis. Patient reports her pain as a 4/10. It is made worse with swallowing. She describes it as feeling like there is a lump in her throat. There are no other associated or modifying factors. History reviewed. No pertinent past medical history.  Past Surgical History  Procedure Date  . Dental surgery     Family History  Problem Relation Age of Onset  . Hypertension Father   . COPD Father     History  Substance Use Topics  . Smoking status: Never Smoker   . Smokeless tobacco: Not on file  . Alcohol Use: No    OB History    Grav Para Term Preterm Abortions TAB SAB Ect Mult Living   2 2 2  0 0 0 0 0 0 2      Review of Systems  Constitutional: Negative.   HENT: Positive for congestion, sore throat and postnasal drip.   Eyes: Negative.   Respiratory: Negative.   Cardiovascular: Negative.   Gastrointestinal: Negative.   Genitourinary: Negative.   Musculoskeletal: Negative.   Skin: Negative.   Neurological: Negative.   Hematological: Negative.   Psychiatric/Behavioral: Negative.   All other systems reviewed and are negative.    Allergies  Darvocet; Erythromycin; Macrobid; Percocet; Pyridium; and Zithromax  Home Medications   Current Outpatient Rx  Name Route Sig Dispense Refill  . VITAMIN B 12 PO Oral Take 1 tablet by mouth daily.    Marland Kitchen  DM-GUAIFENESIN ER 30-600 MG PO TB12 Oral Take 1 tablet by mouth every 12 (twelve) hours.    Joyce Copa PO Oral Take 1 tablet by mouth daily as needed. Patient used this medication for her allergies.    Marland Kitchen FLONASE NA Nasal Place 1 puff into the nose daily as needed.    . AMOXICILLIN 500 MG PO CAPS Oral Take 1 capsule (500 mg total) by mouth 3 (three) times daily. 21 capsule 0  . GREEN COFFEE BEAN PO Oral Take 1 tablet by mouth daily.    . IBUPROFEN 200 MG PO TABS Oral Take 800 mg by mouth every 6 (six) hours as needed. Patient is using this medication for pain in her hand.    Marland Kitchen RASPBERRY PO Oral Take 1 tablet by mouth 2 (two) times daily. Patient is using Raspberry Ketones as a dietary supplement.      BP 117/81  Pulse 75  Temp(Src) 98 F (36.7 C) (Oral)  Resp 18  Wt 140 lb (63.504 kg)  SpO2 100%  Physical Exam  Nursing note and vitals reviewed. GEN: Well-developed, well-nourished female in no distress HEENT: Atraumatic, normocephalic. Oropharynx with erythema and cobblestoning noted posteriorly EYES: PERRLA BL, no scleral icterus. NECK: Trachea midline, no meningismus CV: regular rate and rhythm. No murmurs, rubs, or gallops PULM: No respiratory distress.  No crackles, wheezes, or rales. GI: soft, non-tender. No guarding, rebound, or tenderness. +  bowel sounds  GU: deferred Neuro: cranial nerves 2-12 intact, no abnormalities of strength or sensation, A and O x 3 MSK: Patient moves all 4 extremities symmetrically, no deformity, edema, or injury noted Skin: No rashes petechiae, purpura, or jaundice Psych: no abnormality of mood   ED Course  Procedures (including critical care time)   Labs Reviewed  RAPID STREP SCREEN   No results found.   1. Pharyngitis       MDM  Patient was evaluated by myself. Clinical exam was consistent with strep pharyngitis and patient had 2 children at home with similar symptoms. They have both been tested positive and were being treated with  amoxicillin. Patient did want to have testing done though I told her that I thought I would be treating her regardless. This returned negative however patient did not have significant exudate at the time of sample collection. The patient was treated anyways with amoxicillin based on my clinical suspicion for this. Patient was discharged in good condition with instructions to use saltwater gargle as well as a decongestant she is alert and trying home. She was discharged in good condition.        Cyndra Numbers, MD 09/17/11 [phone]

## 2012-01-24 ENCOUNTER — Telehealth: Payer: Self-pay | Admitting: Obstetrics and Gynecology

## 2012-01-24 NOTE — Telephone Encounter (Signed)
Pt called and thinks she may have a hemorrhoid. Pt also c/o bleeding after intercourse and requests appointment. Pt scheduled with EP 02/02/12, pt accepts appointment.

## 2012-01-24 NOTE — Telephone Encounter (Signed)
Triage/epic 

## 2012-01-25 ENCOUNTER — Encounter (HOSPITAL_COMMUNITY): Payer: Self-pay | Admitting: *Deleted

## 2012-01-25 ENCOUNTER — Inpatient Hospital Stay (HOSPITAL_COMMUNITY)
Admission: AD | Admit: 2012-01-25 | Discharge: 2012-01-25 | Disposition: A | Discharge status: Home / Self Care | Payer: Self-pay | Source: Ambulatory Visit | Attending: Obstetrics and Gynecology | Admitting: Obstetrics and Gynecology

## 2012-01-25 DIAGNOSIS — K644 Residual hemorrhoidal skin tags: Secondary | ICD-10-CM | POA: Insufficient documentation

## 2012-01-25 MED ORDER — LIDOCAINE HCL 2 % EX GEL
Freq: Once | CUTANEOUS | Status: AC
  Administered 2012-01-25: 10 via TOPICAL
  Filled 2012-01-25: qty 20

## 2012-01-25 MED ORDER — HYDROCORTISONE ACE-PRAMOXINE 1-1 % RE FOAM: 1.0000 | Freq: Three times a day (TID) | RECTAL | Status: AC

## 2012-01-25 NOTE — MAU Note (Signed)
Pt LMP 12/24/2011, Pt G2 P2, feels knot on the inside of rectum, very painful today.

## 2012-01-25 NOTE — MAU Provider Note (Signed)
History   [name] is a 36 y.o. WF who presents unannounced w/ CC of hemorrhoid pain.  Reports noticing a "bump" on rt side of anus over the past 1-2 weeks.  No blood w/ BM's or in stool.  Has been using Tucks pads.  Denies constipation; reports regular BM's especially w/ her daily "coffee."  Reports increased sitting since restarted school.  Condoms for BC, but not satisfied, and her s.o. Desires pregnancy, although pt does not.  Pt called office today and appt made w/ EP 02/02/12, but pt is going on a beach trip at end of this week, and wanted to be seen before trip. LMP 12/24/11.    CSN: [id number]  Arrival date and time: 01/25/12 2050   None     Chief Complaint  Patient presents with  . Hemorrhoids   HPI  OB History    Grav Para Term Preterm Abortions TAB SAB Ect Mult Living   2 2 2  0 0 0 0 0 0 2      History reviewed. No pertinent past medical history.  Past Surgical History  Procedure Date  . Dental surgery   . Cryotherapy     Family History  Problem Relation Age of Onset  . Hypertension Father   . COPD Father     History  Substance Use Topics  . Smoking status: Never Smoker   . Smokeless tobacco: Never Used  . Alcohol Use: 0.0 oz/week    1-2 Glasses of wine per week    Allergies:  Allergies  Allergen Reactions  . Darvocet (Propoxyphene-Acetaminophen) Nausea And Vomiting  . Erythromycin Nausea And Vomiting    childhood  . Nitrofurantoin Monohyd Macro Hives  . Percocet (Oxycodone-Acetaminophen) Nausea And Vomiting  . Pyridium (Phenazopyridine Hcl) Hives  . Zithromax (Azithromycin Dihydrate) Nausea And Vomiting    Prescriptions prior to admission  Medication Sig Dispense Refill  . Cyanocobalamin (VITAMIN B 12 PO) Take 1 tablet by mouth daily.      Marland Kitchen dextromethorphan-guaiFENesin (MUCINEX DM) 30-600 MG per 12 hr tablet Take 1 tablet by mouth every 12 (twelve) hours.      . Fexofenadine HCl (ALLEGRA PO) Take 1 tablet by mouth daily as needed. Patient  used this medication for her allergies.      . Fluticasone Propionate (FLONASE NA) Place 1 puff into the nose daily as needed.      . Garcinia Cambogia-Chromium 500-200 MG-MCG TABS Take by mouth. Weight loss      . GREEN COFFEE BEAN PO Take 1 tablet by mouth daily.      . hydrocortisone cream 1 % Apply topically 2 (two) times daily. Rash      . ibuprofen (ADVIL,MOTRIN) 200 MG tablet Take 600 mg by mouth every 6 (six) hours as needed. Patient is using this medication for pain in her hand. Pian in back      . phenylephrine-shark liver oil-mineral oil-petrolatum (PREPARATION H) 0.25-3-14-71.9 % rectal ointment Place rectally 2 (two) times daily as needed.      Marland Kitchen RASPBERRY PO Take 1 tablet by mouth 2 (two) times daily. Patient is using Raspberry Ketones as a dietary supplement.        ROS--see history above Physical Exam   Blood pressure 126/74, pulse 86, temperature 97.8 F (36.6 C), temperature source Oral, resp. rate 16, height 5\' 1"  (1.549 m), weight 143 lb 12.8 oz (65.227 kg), last menstrual period 12/24/2011. .. Results for orders placed during the hospital encounter of 01/25/12 (from the past 24  hour(s))  POCT PREGNANCY, URINE     Status: Normal   Collection Time   01/25/12  9:46 PM      Component Value Range   Preg Test, Ur NEGATIVE  NEGATIVE   Physical Exam  Constitutional: She is oriented to person, place, and time. She appears well-developed and well-nourished. No distress.  HENT:  Head: Normocephalic and atraumatic.  Eyes: Pupils are equal, round, and reactive to light.  Cardiovascular: Normal rate.   Respiratory: Effort normal.  GI: Soft.  Genitourinary:       Inspection of anus:  Small ext hemorrhoid noted between 4 and 5 o'clock, slightly internal and w/ bluish color; measures about 0.5 cm; unsure if thrombosed.  Pt tolerated palpation.  Lidocaine jelly applied.  Neurological: She is alert and oriented to person, place, and time.  Skin: Skin is warm and dry.  Psychiatric:  Her behavior is normal. Thought content normal.    MAU Course  Procedures 1.  Applied Lidocaine jelly to hemorrhoid  Assessment and Plan  1.  External hemorrhoid 2. Negative UPT 3.  Condoms, but desires possible contraception change 4.  Recent increase in sitting with return to school  1.  Lidocaine jelly applied and disc'd comfort measures; Proctofoam HC Rx'd to apply tid. 2.  F/u 02/02/12 to see if healing, and if not, consider general sx referral for possible hemorrhoidectomy 3.  Discuss BC options at appt next week  Peregrine Nolt H 01/25/2012, 11:03 PM

## 2012-01-26 ENCOUNTER — Other Ambulatory Visit: Payer: Self-pay | Admitting: Obstetrics and Gynecology

## 2012-01-26 NOTE — Telephone Encounter (Signed)
TRIAGE/RX REQ °

## 2012-01-26 NOTE — Telephone Encounter (Signed)
DD to address.

## 2012-01-26 NOTE — Telephone Encounter (Signed)
Lm on vm to cb per telephone call.  

## 2012-01-26 NOTE — Telephone Encounter (Signed)
Tc from pt per telephone call. Consulted with DD, pt to try otc Preparation H and Tucks pads due to cost of Proctofoam. Pt also requesting a rx for pain meds due to taking Ibuprofen 800mg  without improvement of discomfort associated with hemorrhoids. Pt also started menses today.Rx denied for rx med and told to try otc Advil (12hour) as directed. Pt to try [facility] as well. Pt voices understanding.

## 2012-02-02 ENCOUNTER — Encounter: Payer: Self-pay | Admitting: Obstetrics and Gynecology

## 2012-02-14 ENCOUNTER — Ambulatory Visit (INDEPENDENT_AMBULATORY_CARE_PROVIDER_SITE_OTHER): Payer: Self-pay | Admitting: Obstetrics and Gynecology

## 2012-02-14 ENCOUNTER — Encounter: Payer: Self-pay | Admitting: Obstetrics and Gynecology

## 2012-02-14 VITALS — BP 112/70 | Temp 98.6°F | Ht 61.0 in | Wt 146.0 lb

## 2012-02-14 DIAGNOSIS — R32 Unspecified urinary incontinence: Secondary | ICD-10-CM

## 2012-02-14 NOTE — Progress Notes (Signed)
35 YO complains of post-coital bleeding when she has intercourse 5-7  days after her period ends  x 2.  Bleeding is like a period for 1 day. Denies any pain with intercourse. In the spring had a month in which she had 2 periods.    Admits to having a hemorrhoid. Last PAP smear was 3 months ago and was normal. Patient has also had problems with her bowel movements in that though soft, she can't fully evcuate and a hemorrhoid but denies any bleeding, pain or itching with this, she can just feel the hemorrhoid (very small). Has not been consuming more than 3 beverages a day in spite of taking Benefiber and doesn't have time to go to the bathroom when she has the urge (always in a hurry).  Menses: flow x 4 days with 2 pad changes/day and no cramps. Lastly for the past several months will leak urine on occasion, with and without increased abdominal pressure.  Denies dysuria, hematuria or flank pain.  O: Abdomen: soft, mildly tender without guarding or rebound      Pelvic:EGBUS-wnl with a non-tender vaginal cyst at 10 o'clock of the the vaginal opening (just inside), vagina-normal, cervix-no lesions, uterus/adnexae-ten-     der (vs lower abdominal tenderness) without masses.  U/A-negative  A: Post Coital Bleeding after menses x 2     Lower Abdominal Tenderness     Bowel issues     Incontinence  P:  Bowel hygiene, may take a laxative to relieve current constipation       Discussed need for ultrasound to evaluate pelvic discomfort & to rule out pelvic masses       To consult M.D. about management/evaluation given patient is self pay       RTO-as scheduled or prn  Charlotte Broyles, PA-C

## 2012-02-14 NOTE — Progress Notes (Signed)
PT ALREADY HAD PAP X 2 MONTHS AGO AT [facility];PT STATES PAP WNL.

## 2012-02-15 LAB — POCT URINALYSIS DIPSTICK
Glucose, UA: NEGATIVE
Ketones, UA: NEGATIVE
Leukocytes, UA: NEGATIVE
Spec Grav, UA: 1.01
Urobilinogen, UA: NEGATIVE

## 2012-02-16 ENCOUNTER — Telehealth: Payer: Self-pay | Admitting: Obstetrics and Gynecology

## 2012-02-16 NOTE — Telephone Encounter (Signed)
Call to patient, after consult with Dr. Pennie Rushing who suggested a GC/Chlamydia culture if not done in past 3 months and possible trial of Doxycycline bid x 7 days for cervicitis and reassess in 6 weeks. She emphatically refuses GC & Chlamydia and doesn't like taking medicine so she doesn't want to take the Doxycycline if there is no guarantee that it will work.  She will observe for now and if bleeding continues will say money for an ultrasound.  Patient will obtain results of her PAP smear from [facility] and have them send them to our office or she will bring them to [place]. Naketa Daddario, PA-C

## 2012-05-09 ENCOUNTER — Encounter (HOSPITAL_BASED_OUTPATIENT_CLINIC_OR_DEPARTMENT_OTHER): Payer: Self-pay | Admitting: *Deleted

## 2012-05-09 ENCOUNTER — Emergency Department (HOSPITAL_BASED_OUTPATIENT_CLINIC_OR_DEPARTMENT_OTHER)
Admission: EM | Admit: 2012-05-09 | Discharge: 2012-05-09 | Disposition: A | Discharge status: Home / Self Care | Payer: Self-pay | Attending: Emergency Medicine | Admitting: Emergency Medicine

## 2012-05-09 DIAGNOSIS — R059 Cough, unspecified: Secondary | ICD-10-CM | POA: Insufficient documentation

## 2012-05-09 DIAGNOSIS — R05 Cough: Secondary | ICD-10-CM | POA: Insufficient documentation

## 2012-05-09 DIAGNOSIS — R5383 Other fatigue: Secondary | ICD-10-CM | POA: Insufficient documentation

## 2012-05-09 DIAGNOSIS — J069 Acute upper respiratory infection, unspecified: Secondary | ICD-10-CM | POA: Insufficient documentation

## 2012-05-09 DIAGNOSIS — R8789 Other abnormal findings in specimens from female genital organs: Secondary | ICD-10-CM | POA: Insufficient documentation

## 2012-05-09 DIAGNOSIS — Z87448 Personal history of other diseases of urinary system: Secondary | ICD-10-CM | POA: Insufficient documentation

## 2012-05-09 DIAGNOSIS — IMO0001 Reserved for inherently not codable concepts without codable children: Secondary | ICD-10-CM | POA: Insufficient documentation

## 2012-05-09 DIAGNOSIS — Z79899 Other long term (current) drug therapy: Secondary | ICD-10-CM | POA: Insufficient documentation

## 2012-05-09 DIAGNOSIS — N926 Irregular menstruation, unspecified: Secondary | ICD-10-CM | POA: Insufficient documentation

## 2012-05-09 DIAGNOSIS — R0982 Postnasal drip: Secondary | ICD-10-CM | POA: Insufficient documentation

## 2012-05-09 DIAGNOSIS — Z87891 Personal history of nicotine dependence: Secondary | ICD-10-CM | POA: Insufficient documentation

## 2012-05-09 DIAGNOSIS — Z8742 Personal history of other diseases of the female genital tract: Secondary | ICD-10-CM | POA: Insufficient documentation

## 2012-05-09 DIAGNOSIS — R5381 Other malaise: Secondary | ICD-10-CM | POA: Insufficient documentation

## 2012-05-09 MED ORDER — OXYMETAZOLINE HCL 0.05 % NA SOLN: 2.0000 | Freq: Two times a day (BID) | NASAL | Status: DC

## 2012-05-09 MED ORDER — IBUPROFEN 600 MG PO TABS: 600.0000 mg | ORAL_TABLET | Freq: Four times a day (QID) | ORAL | Status: DC | PRN

## 2012-05-09 MED ORDER — MOMETASONE FUROATE 50 MCG/ACT NA SUSP: 2.0000 | Freq: Every day | NASAL | Status: DC

## 2012-05-09 MED ORDER — BENZONATATE 100 MG PO CAPS: 100.0000 mg | ORAL_CAPSULE | Freq: Three times a day (TID) | ORAL | Status: DC

## 2012-05-09 NOTE — ED Provider Notes (Signed)
History     CSN: [id number]  Arrival date & time 05/09/12  4098   First MD Initiated Contact with Patient 05/09/12 [phone]      Chief Complaint  Patient presents with  . Cough    (Consider location/radiation/quality/duration/timing/severity/associated sxs/prior treatment) HPI Pt present with 3-4 days of nasal congestion, dry cough and fatigue. States she has had home renovations recently with increased dust in the air. No fever or chill. No chest pain or SOB.  Past Medical History  Diagnosis Date  . Irregular periods/menstrual cycles   . Dysmenorrhea   . Paresthesia of right leg   . Breast cyst     left  . HPV (human papilloma virus) infection   . Pyelonephritis 7/06, 1988    Past Surgical History  Procedure Date  . Dental surgery   . Cryotherapy     Family History  Problem Relation Age of Onset  . Hypertension Father   . COPD Father   . Depression Father   . Emphysema Father   . Heart murmur Mother   . Cleft lip Sister   . Cancer Maternal Grandmother     lung  . Diabetes Maternal Grandfather     History  Substance Use Topics  . Smoking status: Former Smoker    Quit date: 05/23/2002  . Smokeless tobacco: Never Used  . Alcohol Use: 0.0 oz/week    1-2 Glasses of wine per week    OB History    Grav Para Term Preterm Abortions TAB SAB Ect Mult Living   2 2 2  0 0 0 0 0 0 2      Review of Systems  Constitutional: Positive for fatigue. Negative for fever and chills.  HENT: Positive for congestion, rhinorrhea and postnasal drip. Negative for sore throat, sneezing, neck pain, neck stiffness, dental problem and sinus pressure.   Respiratory: Positive for cough. Negative for chest tightness, shortness of breath and wheezing.   Cardiovascular: Negative for chest pain, palpitations and leg swelling.  Gastrointestinal: Negative for nausea, vomiting and abdominal pain.  Musculoskeletal: Positive for myalgias. Negative for back pain.  Skin: Negative for rash and  wound.  Neurological: Negative for dizziness, syncope, weakness, numbness and headaches.  All other systems reviewed and are negative.    Allergies  Bee pollen; Darvocet; Erythromycin; Nitrofurantoin monohyd macro; Percocet; Pyridium; and Zithromax  Home Medications   Current Outpatient Rx  Name  Route  Sig  Dispense  Refill  . OMEGA-3 FATTY ACIDS 1000 MG PO CAPS   Oral   Take 2 g by mouth daily.         Marland Kitchen BENZONATATE 100 MG PO CAPS   Oral   Take 1 capsule (100 mg total) by mouth every 8 (eight) hours.   21 capsule   0   . VITAMIN B 12 PO   Oral   Take 1 tablet by mouth daily.         Marland Kitchen DM-GUAIFENESIN ER 30-600 MG PO TB12   Oral   Take 1 tablet by mouth every 12 (twelve) hours.         Joyce Copa PO   Oral   Take 1 tablet by mouth daily as needed. Patient used this medication for her allergies.         Marland Kitchen FLONASE NA   Nasal   Place 1 puff into the nose daily as needed.         Marland Kitchen GARCINIA CAMBOGIA-CHROMIUM 500-200 MG-MCG PO TABS   Oral  Take by mouth. Weight loss         . GREEN COFFEE BEAN PO   Oral   Take 1 tablet by mouth daily.         Marland Kitchen HYDROCORTISONE 1 % EX CREA   Topical   Apply topically 2 (two) times daily. Rash         . IBUPROFEN 200 MG PO TABS   Oral   Take 600 mg by mouth every 6 (six) hours as needed. Patient is using this medication for pain in her hand. Pian in back         . IBUPROFEN 600 MG PO TABS   Oral   Take 1 tablet (600 mg total) by mouth every 6 (six) hours as needed for pain.   30 tablet   0   . MOMETASONE FUROATE 50 MCG/ACT NA SUSP   Nasal   Place 2 sprays into the nose daily.   17 g   12   . ONE-DAILY MULTI VITAMINS PO TABS   Oral   Take 1 tablet by mouth daily.         Marland Kitchen OXYMETAZOLINE HCL 0.05 % NA SOLN   Nasal   Place 2 sprays into the nose 2 (two) times daily.   30 mL   0   . PHENYLEPH-SHARK LIV OIL-MO-PET 0.25-3-14-71.9 % RE OINT   Rectal   Place rectally 2 (two) times daily as needed.          Marland Kitchen RASPBERRY PO   Oral   Take 1 tablet by mouth 2 (two) times daily. Patient is using Raspberry Ketones as a dietary supplement.         Weyman Croon HAZEL-GLYCERIN EX PADS   Rectal   Place rectally as needed.           BP 134/87  Pulse 70  Temp 98.3 F (36.8 C) (Oral)  Ht 5\' 1"  (1.549 m)  Wt 137 lb (62.143 kg)  BMI 25.89 kg/m2  SpO2 100%  LMP 05/04/2012  Physical Exam  Nursing note and vitals reviewed. Constitutional: She is oriented to person, place, and time. She appears well-developed and well-nourished. No distress.  HENT:  Head: Normocephalic and atraumatic.  Mouth/Throat: Oropharynx is clear and moist. No oropharyngeal exudate.  Eyes: EOM are normal. Pupils are equal, round, and reactive to light.  Neck: Normal range of motion. Neck supple.  Cardiovascular: Normal rate and regular rhythm.   Pulmonary/Chest: Effort normal and breath sounds normal. No respiratory distress. She has no wheezes. She has no rales. She exhibits no tenderness.  Abdominal: Soft. Bowel sounds are normal. She exhibits no mass. There is no tenderness. There is no rebound and no guarding.  Musculoskeletal: Normal range of motion. She exhibits no edema and no tenderness.       No lower ext swelling or pain  Neurological: She is alert and oriented to person, place, and time.  Skin: Skin is warm and dry. No rash noted. No erythema.  Psychiatric: She has a normal mood and affect. Her behavior is normal.    ED Course  Procedures (including critical care time)  Labs Reviewed - No data to display No results found.   1. Acute URI       MDM  Will give meds for symptomatic control        Loren Racer, MD 05/09/12 1330

## 2012-05-09 NOTE — ED Notes (Signed)
Patient states she has had a non productive cough for the last 3-4 days.  States several days ago she had sinus drainage which has resolved.  C/O soreness in chest from coughing.

## 2012-08-29 ENCOUNTER — Other Ambulatory Visit: Payer: Self-pay

## 2012-08-29 ENCOUNTER — Emergency Department (HOSPITAL_BASED_OUTPATIENT_CLINIC_OR_DEPARTMENT_OTHER): Payer: Self-pay

## 2012-08-29 ENCOUNTER — Encounter (HOSPITAL_BASED_OUTPATIENT_CLINIC_OR_DEPARTMENT_OTHER): Payer: Self-pay | Admitting: Emergency Medicine

## 2012-08-29 DIAGNOSIS — Z87891 Personal history of nicotine dependence: Secondary | ICD-10-CM | POA: Insufficient documentation

## 2012-08-29 DIAGNOSIS — Z8669 Personal history of other diseases of the nervous system and sense organs: Secondary | ICD-10-CM | POA: Insufficient documentation

## 2012-08-29 DIAGNOSIS — Z8619 Personal history of other infectious and parasitic diseases: Secondary | ICD-10-CM | POA: Insufficient documentation

## 2012-08-29 DIAGNOSIS — Z79899 Other long term (current) drug therapy: Secondary | ICD-10-CM | POA: Insufficient documentation

## 2012-08-29 DIAGNOSIS — Z8742 Personal history of other diseases of the female genital tract: Secondary | ICD-10-CM | POA: Insufficient documentation

## 2012-08-29 DIAGNOSIS — IMO0002 Reserved for concepts with insufficient information to code with codable children: Secondary | ICD-10-CM | POA: Insufficient documentation

## 2012-08-29 DIAGNOSIS — R5381 Other malaise: Secondary | ICD-10-CM | POA: Insufficient documentation

## 2012-08-29 DIAGNOSIS — R079 Chest pain, unspecified: Secondary | ICD-10-CM | POA: Insufficient documentation

## 2012-08-29 DIAGNOSIS — Z87448 Personal history of other diseases of urinary system: Secondary | ICD-10-CM | POA: Insufficient documentation

## 2012-08-29 DIAGNOSIS — Z872 Personal history of diseases of the skin and subcutaneous tissue: Secondary | ICD-10-CM | POA: Insufficient documentation

## 2012-08-29 NOTE — ED Notes (Signed)
Pt c/o intermittent sharp chest pain in left axillary area. Pt also c/o fatigue. Denies shob,nausea or diaphoresis.

## 2012-08-30 ENCOUNTER — Emergency Department (HOSPITAL_BASED_OUTPATIENT_CLINIC_OR_DEPARTMENT_OTHER)
Admission: EM | Admit: 2012-08-30 | Discharge: 2012-08-30 | Disposition: A | Discharge status: Home / Self Care | Payer: Self-pay | Attending: Emergency Medicine | Admitting: Emergency Medicine

## 2012-08-30 DIAGNOSIS — R079 Chest pain, unspecified: Secondary | ICD-10-CM

## 2012-08-30 LAB — CBC WITH DIFFERENTIAL/PLATELET
Basophils Absolute: 0 10*3/uL (ref 0.0–0.1)
Eosinophils Relative: 2 % (ref 0–5)
HCT: 37.8 % (ref 36.0–46.0)
Lymphocytes Relative: 27 % (ref 12–46)
Lymphs Abs: 1.9 10*3/uL (ref 0.7–4.0)
MCV: 89.6 fL (ref 78.0–100.0)
Neutro Abs: 4.3 10*3/uL (ref 1.7–7.7)
Platelets: 255 10*3/uL (ref 150–400)
RBC: 4.22 MIL/uL (ref 3.87–5.11)
WBC: 6.8 10*3/uL (ref 4.0–10.5)

## 2012-08-30 LAB — BASIC METABOLIC PANEL
CO2: 28 mEq/L (ref 19–32)
Calcium: 9.1 mg/dL (ref 8.4–10.5)
Chloride: 103 mEq/L (ref 96–112)
Glucose, Bld: 97 mg/dL (ref 70–99)
Potassium: 3.8 mEq/L (ref 3.5–5.1)
Sodium: 137 mEq/L (ref 135–145)

## 2012-08-30 LAB — TROPONIN I: Troponin I: <0.3 ng/mL (ref <0.30)

## 2012-08-30 NOTE — ED Provider Notes (Signed)
History     CSN: [id number]  Arrival date & time 08/29/12  2213   First MD Initiated Contact with Patient 08/30/12 0232      Chief Complaint  Patient presents with  . Chest Pain    (Consider location/radiation/quality/duration/timing/severity/associated sxs/prior treatment) HPI This is a 37 year old female who is been having intermittent chest pain such as to morning about 11 AM. The pains are sharp and stabbing. They last about 3 seconds. Apparently located below the left breast at about the anterior axillary line. There is no known trigger. There is no specific pattern to their frequency. There is no associated shortness of breath, nausea or diaphoresis. They're not brought on by movement or deep breathing. They're moderate to severe in intensity. She is also having some generalized fatigue.  Past Medical History  Diagnosis Date  . Irregular periods/menstrual cycles   . Dysmenorrhea   . Paresthesia of right leg   . Breast cyst     left  . HPV (human papilloma virus) infection   . Pyelonephritis 7/06, 1988    Past Surgical History  Procedure Laterality Date  . Dental surgery    . Cryotherapy      Family History  Problem Relation Age of Onset  . Hypertension Father   . COPD Father   . Depression Father   . Emphysema Father   . Heart murmur Mother   . Cleft lip Sister   . Cancer Maternal Grandmother     lung  . Diabetes Maternal Grandfather     History  Substance Use Topics  . Smoking status: Former Smoker    Quit date: 05/23/2002  . Smokeless tobacco: Never Used  . Alcohol Use: 0.0 oz/week    1-2 Glasses of wine per week    OB History   Grav Para Term Preterm Abortions TAB SAB Ect Mult Living   2 2 2  0 0 0 0 0 0 2      Review of Systems  All other systems reviewed and are negative.    Allergies  Bee pollen; Darvocet; Erythromycin; Nitrofurantoin monohyd macro; Percocet; Pyridium; and Zithromax  Home Medications   Current Outpatient Rx  Name   Route  Sig  Dispense  Refill  . benzonatate (TESSALON) 100 MG capsule   Oral   Take 1 capsule (100 mg total) by mouth every 8 (eight) hours.   21 capsule   0   . Cyanocobalamin (VITAMIN B 12 PO)   Oral   Take 1 tablet by mouth daily.         Marland Kitchen dextromethorphan-guaiFENesin (MUCINEX DM) 30-600 MG per 12 hr tablet   Oral   Take 1 tablet by mouth every 12 (twelve) hours.         . Fexofenadine HCl (ALLEGRA PO)   Oral   Take 1 tablet by mouth daily as needed. Patient used this medication for her allergies.         . fish oil-omega-3 fatty acids 1000 MG capsule   Oral   Take 2 g by mouth daily.         . Fluticasone Propionate (FLONASE NA)   Nasal   Place 1 puff into the nose daily as needed.         . Garcinia Cambogia-Chromium 500-200 MG-MCG TABS   Oral   Take by mouth. Weight loss         . GREEN COFFEE BEAN PO   Oral   Take 1 tablet by mouth daily.         Marland Kitchen  hydrocortisone cream 1 %   Topical   Apply topically 2 (two) times daily. Rash         . ibuprofen (ADVIL,MOTRIN) 200 MG tablet   Oral   Take 600 mg by mouth every 6 (six) hours as needed. Patient is using this medication for pain in her hand. Pian in back         . ibuprofen (ADVIL,MOTRIN) 600 MG tablet   Oral   Take 1 tablet (600 mg total) by mouth every 6 (six) hours as needed for pain.   30 tablet   0   . mometasone (NASONEX) 50 MCG/ACT nasal spray   Nasal   Place 2 sprays into the nose daily.   17 g   12   . Multiple Vitamin (MULTIVITAMIN) tablet   Oral   Take 1 tablet by mouth daily.         Marland Kitchen oxymetazoline (AFRIN NASAL SPRAY) 0.05 % nasal spray   Nasal   Place 2 sprays into the nose 2 (two) times daily.   30 mL   0   . phenylephrine-shark liver oil-mineral oil-petrolatum (PREPARATION H) 0.25-3-14-71.9 % rectal ointment   Rectal   Place rectally 2 (two) times daily as needed.         Marland Kitchen RASPBERRY PO   Oral   Take 1 tablet by mouth 2 (two) times daily. Patient is using  Raspberry Ketones as a dietary supplement.         Marland Kitchen witch hazel-glycerin (TUCKS) pad   Rectal   Place rectally as needed.           BP 112/71  Pulse 60  Temp(Src) 98.3 F (36.8 C) (Oral)  Resp 16  Ht 5' (1.524 m)  Wt 140 lb (63.504 kg)  BMI 27.34 kg/m2  SpO2 100%  LMP 08/08/2012  Physical Exam General: Well-developed, well-nourished female in no acute distress; appearance consistent with age of record HENT: normocephalic, atraumatic Eyes: pupils equal round and reactive to light; extraocular muscles intact Neck: supple Heart: regular rate and rhythm; no murmurs, rubs or gallops Lungs: clear to auscultation bilaterally Chest: Nontender Abdomen: soft; nondistended; nontender; bowel sounds present Extremities: No deformity; full range of motion; pulses normal Neurologic: Awake, alert and oriented; motor function intact in all extremities and symmetric; no facial droop Skin: Warm and dry Psychiatric: Normal mood and affect    ED Course  Procedures (including critical care time)     MDM   Nursing notes and vitals signs, including pulse oximetry, reviewed.  Summary of this visit's results, reviewed by myself:  Labs:  Results for orders placed during the hospital encounter of 08/30/12 (from the past 24 hour(s))  CBC WITH DIFFERENTIAL     Status: None   Collection Time    08/30/12 12:52 AM      Result Value Range   WBC 6.8  4.0 - 10.5 K/uL   RBC 4.22  3.87 - 5.11 MIL/uL   Hemoglobin 12.9  12.0 - 15.0 g/dL   HCT 16.1  09.6 - 04.5 %   MCV 89.6  78.0 - 100.0 fL   MCH 30.6  26.0 - 34.0 pg   MCHC 34.1  30.0 - 36.0 g/dL   RDW 40.9  81.1 - 91.4 %   Platelets 255  150 - 400 K/uL   Neutrophils Relative 63  43 - 77 %   Neutro Abs 4.3  1.7 - 7.7 K/uL   Lymphocytes Relative 27  12 - 46 %  Lymphs Abs 1.9  0.7 - 4.0 K/uL   Monocytes Relative 8  3 - 12 %   Monocytes Absolute 0.5  0.1 - 1.0 K/uL   Eosinophils Relative 2  0 - 5 %   Eosinophils Absolute 0.1  0.0 - 0.7  K/uL   Basophils Relative 0  0 - 1 %   Basophils Absolute 0.0  0.0 - 0.1 K/uL  BASIC METABOLIC PANEL     Status: None   Collection Time    08/30/12 12:52 AM      Result Value Range   Sodium 137  135 - 145 mEq/L   Potassium 3.8  3.5 - 5.1 mEq/L   Chloride 103  96 - 112 mEq/L   CO2 28  19 - 32 mEq/L   Glucose, Bld 97  70 - 99 mg/dL   BUN 15  6 - 23 mg/dL   Creatinine, Ser 2.13  0.50 - 1.10 mg/dL   Calcium 9.1  8.4 - 08.6 mg/dL   GFR calc non Af Amer >90  >90 mL/min   GFR calc Af Amer >90  >90 mL/min  TROPONIN I     Status: None   Collection Time    08/30/12  2:42 AM      Result Value Range   Troponin I <0.30  <0.30 ng/mL    Imaging Studies: Dg Chest 2 View  08/29/2012  *RADIOLOGY REPORT*  Clinical Data: Left-sided chest pain and fatigue.  History of smoking.  CHEST - 2 VIEW  Comparison: None.  Findings: The lungs are well-aerated and clear.  There is no evidence of focal opacification, pleural effusion or pneumothorax. Bilateral nipple shadows are seen.  The heart is normal in size; the mediastinal contour is within normal limits.  No acute osseous abnormalities are seen.  IMPRESSION: No acute cardiopulmonary process seen.   Original Report Authenticated By: Tonia Ghent, M.D.       EKG Interpretation:  Date & Time: 08/29/2012 10:30 PM  Rate: 80  Rhythm: normal sinus rhythm  QRS Axis: normal  Intervals: normal  ST/T Wave abnormalities: normal  Conduction Disutrbances:none  Narrative Interpretation: Left atrial enlargement  Old EKG Reviewed: none available  3:30 AM Patient has been asymptomatic for about the past 45 minutes. Chest pain only lasts a few seconds is unlikely to be cardiac in origin. No premature beats or other arrhythmia were seen on the monitor. She was advised that if this persists she may wish to followup with a cardiologist for long-term monitoring.       Hanley Seamen, MD 08/30/12 [phone]

## 2012-10-02 ENCOUNTER — Encounter (HOSPITAL_BASED_OUTPATIENT_CLINIC_OR_DEPARTMENT_OTHER): Payer: Self-pay | Admitting: *Deleted

## 2012-10-02 ENCOUNTER — Emergency Department (HOSPITAL_BASED_OUTPATIENT_CLINIC_OR_DEPARTMENT_OTHER)
Admission: EM | Admit: 2012-10-02 | Discharge: 2012-10-02 | Disposition: A | Discharge status: Home / Self Care | Payer: Self-pay | Attending: Emergency Medicine | Admitting: Emergency Medicine

## 2012-10-02 DIAGNOSIS — Z87448 Personal history of other diseases of urinary system: Secondary | ICD-10-CM | POA: Insufficient documentation

## 2012-10-02 DIAGNOSIS — Z8619 Personal history of other infectious and parasitic diseases: Secondary | ICD-10-CM | POA: Insufficient documentation

## 2012-10-02 DIAGNOSIS — Z8669 Personal history of other diseases of the nervous system and sense organs: Secondary | ICD-10-CM | POA: Insufficient documentation

## 2012-10-02 DIAGNOSIS — Z8742 Personal history of other diseases of the female genital tract: Secondary | ICD-10-CM | POA: Insufficient documentation

## 2012-10-02 DIAGNOSIS — J3489 Other specified disorders of nose and nasal sinuses: Secondary | ICD-10-CM | POA: Insufficient documentation

## 2012-10-02 DIAGNOSIS — IMO0002 Reserved for concepts with insufficient information to code with codable children: Secondary | ICD-10-CM | POA: Insufficient documentation

## 2012-10-02 DIAGNOSIS — IMO0001 Reserved for inherently not codable concepts without codable children: Secondary | ICD-10-CM | POA: Insufficient documentation

## 2012-10-02 DIAGNOSIS — R509 Fever, unspecified: Secondary | ICD-10-CM | POA: Insufficient documentation

## 2012-10-02 DIAGNOSIS — J209 Acute bronchitis, unspecified: Secondary | ICD-10-CM | POA: Insufficient documentation

## 2012-10-02 DIAGNOSIS — J029 Acute pharyngitis, unspecified: Secondary | ICD-10-CM | POA: Insufficient documentation

## 2012-10-02 DIAGNOSIS — R52 Pain, unspecified: Secondary | ICD-10-CM | POA: Insufficient documentation

## 2012-10-02 DIAGNOSIS — Z79899 Other long term (current) drug therapy: Secondary | ICD-10-CM | POA: Insufficient documentation

## 2012-10-02 DIAGNOSIS — Z87891 Personal history of nicotine dependence: Secondary | ICD-10-CM | POA: Insufficient documentation

## 2012-10-02 MED ORDER — AMOXICILLIN 500 MG PO CAPS: 500.0000 mg | ORAL_CAPSULE | Freq: Three times a day (TID) | ORAL | Status: DC

## 2012-10-02 NOTE — ED Notes (Signed)
Pt states that she has had cough and congestion that started as allergies acting up one week ago. Has progressed to coughing up greenish sputum with low grade temp and generalized body aches for the last 24 days.

## 2012-10-02 NOTE — ED Provider Notes (Addendum)
History     CSN: [id number]  Arrival date & time 10/02/12  0920   First MD Initiated Contact with Patient 10/02/12 1008      Chief Complaint  Patient presents with  . Cough  . Generalized Body Aches    (Consider location/radiation/quality/duration/timing/severity/associated sxs/prior treatment) Patient is a 37 y.o. female presenting with cough. The history is provided by the patient.  Cough Cough characteristics:  Productive Sputum characteristics:  Green Severity:  Moderate Onset quality:  Gradual Duration:  3 weeks Timing:  Constant Progression:  Worsening Chronicity:  New Smoker: no   Relieved by:  Nothing Worsened by:  Nothing tried Ineffective treatments:  Cough suppressants and decongestant Associated symptoms: chills, fever, myalgias, sinus congestion and sore throat     Past Medical History  Diagnosis Date  . Irregular periods/menstrual cycles   . Dysmenorrhea   . Paresthesia of right leg   . Breast cyst     left  . HPV (human papilloma virus) infection   . Pyelonephritis 7/06, 1988    Past Surgical History  Procedure Laterality Date  . Dental surgery    . Cryotherapy      Family History  Problem Relation Age of Onset  . Hypertension Father   . COPD Father   . Depression Father   . Emphysema Father   . Heart murmur Mother   . Cleft lip Sister   . Cancer Maternal Grandmother     lung  . Diabetes Maternal Grandfather     History  Substance Use Topics  . Smoking status: Former Smoker    Quit date: 05/23/2002  . Smokeless tobacco: Never Used  . Alcohol Use: 0.0 oz/week    1-2 Glasses of wine per week    OB History   Grav Para Term Preterm Abortions TAB SAB Ect Mult Living   2 2 2  0 0 0 0 0 0 2      Review of Systems  Constitutional: Positive for fever and chills.  HENT: Positive for sore throat.   Respiratory: Positive for cough.   Musculoskeletal: Positive for myalgias.  All other systems reviewed and are  negative.    Allergies  Bee pollen; Darvocet; Erythromycin; Nitrofurantoin monohyd macro; Percocet; Pyridium; and Zithromax  Home Medications   Current Outpatient Rx  Name  Route  Sig  Dispense  Refill  . benzonatate (TESSALON) 100 MG capsule   Oral   Take 1 capsule (100 mg total) by mouth every 8 (eight) hours.   21 capsule   0   . Cyanocobalamin (VITAMIN B 12 PO)   Oral   Take 1 tablet by mouth daily.         Marland Kitchen dextromethorphan-guaiFENesin (MUCINEX DM) 30-600 MG per 12 hr tablet   Oral   Take 1 tablet by mouth every 12 (twelve) hours.         . Fexofenadine HCl (ALLEGRA PO)   Oral   Take 1 tablet by mouth daily as needed. Patient used this medication for her allergies.         . fish oil-omega-3 fatty acids 1000 MG capsule   Oral   Take 2 g by mouth daily.         . Fluticasone Propionate (FLONASE NA)   Nasal   Place 1 puff into the nose daily as needed.         . Garcinia Cambogia-Chromium 500-200 MG-MCG TABS   Oral   Take by mouth. Weight loss         .  GREEN COFFEE BEAN PO   Oral   Take 1 tablet by mouth daily.         . hydrocortisone cream 1 %   Topical   Apply topically 2 (two) times daily. Rash         . ibuprofen (ADVIL,MOTRIN) 200 MG tablet   Oral   Take 600 mg by mouth every 6 (six) hours as needed. Patient is using this medication for pain in her hand. Pian in back         . ibuprofen (ADVIL,MOTRIN) 600 MG tablet   Oral   Take 1 tablet (600 mg total) by mouth every 6 (six) hours as needed for pain.   30 tablet   0   . mometasone (NASONEX) 50 MCG/ACT nasal spray   Nasal   Place 2 sprays into the nose daily.   17 g   12   . Multiple Vitamin (MULTIVITAMIN) tablet   Oral   Take 1 tablet by mouth daily.         Marland Kitchen oxymetazoline (AFRIN NASAL SPRAY) 0.05 % nasal spray   Nasal   Place 2 sprays into the nose 2 (two) times daily.   30 mL   0   . phenylephrine-shark liver oil-mineral oil-petrolatum (PREPARATION H)  0.25-3-14-71.9 % rectal ointment   Rectal   Place rectally 2 (two) times daily as needed.         Marland Kitchen RASPBERRY PO   Oral   Take 1 tablet by mouth 2 (two) times daily. Patient is using Raspberry Ketones as a dietary supplement.         Marland Kitchen witch hazel-glycerin (TUCKS) pad   Rectal   Place rectally as needed.           BP 128/89  Pulse 73  Temp(Src) 98.8 F (37.1 C) (Oral)  SpO2 98%  LMP 09/18/2012  Physical Exam  Nursing note and vitals reviewed. Constitutional: She is oriented to person, place, and time. She appears well-developed and well-nourished. No distress.  HENT:  Head: Normocephalic and atraumatic.  Mouth/Throat: Oropharynx is clear and moist.  Neck: Normal range of motion. Neck supple.  Cardiovascular: Normal rate and regular rhythm.  Exam reveals no gallop and no friction rub.   No murmur heard. Pulmonary/Chest: Effort normal and breath sounds normal. No respiratory distress. She has no wheezes.  Abdominal: Soft. Bowel sounds are normal. She exhibits no distension. There is no tenderness.  Musculoskeletal: Normal range of motion.  Neurological: She is alert and oriented to person, place, and time.  Skin: Skin is warm and dry. She is not diaphoretic.    ED Course  Procedures (including critical care time)  Labs Reviewed - No data to display No results found.   No diagnosis found.    MDM  Persistent uri for three weeks, now moving into chest.  Will treat with amoxicillin, otc meds, return prn.        Geoffery Lyons, MD 10/02/12 1014  Geoffery Lyons, MD 10/02/12 1016

## 2013-04-12 ENCOUNTER — Emergency Department (HOSPITAL_BASED_OUTPATIENT_CLINIC_OR_DEPARTMENT_OTHER)
Admission: EM | Admit: 2013-04-12 | Discharge: 2013-04-12 | Disposition: A | Discharge status: Home / Self Care | Payer: Medicaid Other | Attending: Emergency Medicine | Admitting: Emergency Medicine

## 2013-04-12 ENCOUNTER — Encounter (HOSPITAL_BASED_OUTPATIENT_CLINIC_OR_DEPARTMENT_OTHER): Payer: Self-pay | Admitting: Emergency Medicine

## 2013-04-12 DIAGNOSIS — K029 Dental caries, unspecified: Secondary | ICD-10-CM | POA: Insufficient documentation

## 2013-04-12 DIAGNOSIS — IMO0002 Reserved for concepts with insufficient information to code with codable children: Secondary | ICD-10-CM | POA: Insufficient documentation

## 2013-04-12 DIAGNOSIS — Z87891 Personal history of nicotine dependence: Secondary | ICD-10-CM | POA: Insufficient documentation

## 2013-04-12 DIAGNOSIS — Z872 Personal history of diseases of the skin and subcutaneous tissue: Secondary | ICD-10-CM | POA: Insufficient documentation

## 2013-04-12 DIAGNOSIS — Z87448 Personal history of other diseases of urinary system: Secondary | ICD-10-CM | POA: Insufficient documentation

## 2013-04-12 DIAGNOSIS — Z9889 Other specified postprocedural states: Secondary | ICD-10-CM | POA: Insufficient documentation

## 2013-04-12 DIAGNOSIS — Z8619 Personal history of other infectious and parasitic diseases: Secondary | ICD-10-CM | POA: Insufficient documentation

## 2013-04-12 DIAGNOSIS — Z79899 Other long term (current) drug therapy: Secondary | ICD-10-CM | POA: Insufficient documentation

## 2013-04-12 DIAGNOSIS — K089 Disorder of teeth and supporting structures, unspecified: Secondary | ICD-10-CM | POA: Insufficient documentation

## 2013-04-12 DIAGNOSIS — K0889 Other specified disorders of teeth and supporting structures: Secondary | ICD-10-CM

## 2013-04-12 DIAGNOSIS — Z792 Long term (current) use of antibiotics: Secondary | ICD-10-CM | POA: Insufficient documentation

## 2013-04-12 DIAGNOSIS — Z8742 Personal history of other diseases of the female genital tract: Secondary | ICD-10-CM | POA: Insufficient documentation

## 2013-04-12 MED ORDER — HYDROCODONE-ACETAMINOPHEN 5-325 MG PO TABS: 1.0000 | ORAL_TABLET | ORAL | Status: DC | PRN

## 2013-04-12 MED ORDER — AMOXICILLIN 500 MG PO CAPS: 500.0000 mg | ORAL_CAPSULE | Freq: Three times a day (TID) | ORAL | Status: DC

## 2013-04-12 NOTE — ED Notes (Signed)
Dental abscess. Pain and swelling since yesterday.  

## 2013-04-12 NOTE — ED Provider Notes (Signed)
CSN: [id number]     Arrival date & time 04/12/13  2010 History   First MD Initiated Contact with Patient 04/12/13 2024     Chief Complaint  Patient presents with  . Dental Problem   (Consider location/radiation/quality/duration/timing/severity/associated sxs/prior Treatment) HPI Patient presents to the emergency department chief complaint of dental pain.  Patient states he's had some soreness in her right lower molar for the past few days.  Today she was chewing and she had some change in paste as well as pain in the teeth.  She felt with her, a small swelling just below the tooth base.  Patient was concerned she may have a developing abscess and came here for treatment.  She has mild to moderate pain at this point.  No discharge, no difficulty breathing, pain with swallowing, fevers, chills, myalgias or malaise.  He has a history of poor dentition.  She has a set of dentures on the top teeth.  Bottom teeth are markedly decayed.  She is waiting for dental removal of the bottom teeth.  Past Medical History  Diagnosis Date  . Irregular periods/menstrual cycles   . Dysmenorrhea   . Paresthesia of right leg   . Breast cyst     left  . HPV (human papilloma virus) infection   . Pyelonephritis 7/06, 1988   Past Surgical History  Procedure Laterality Date  . Dental surgery    . Cryotherapy     Family History  Problem Relation Age of Onset  . Hypertension Father   . COPD Father   . Depression Father   . Emphysema Father   . Heart murmur Mother   . Cleft lip Sister   . Cancer Maternal Grandmother     lung  . Diabetes Maternal Grandfather    History  Substance Use Topics  . Smoking status: Former Smoker    Quit date: 05/23/2002  . Smokeless tobacco: Never Used  . Alcohol Use: 0.0 oz/week    1-2 Glasses of wine per week   OB History   Grav Para Term Preterm Abortions TAB SAB Ect Mult Living   2 2 2  0 0 0 0 0 0 2     Review of Systems  Constitutional: Negative for fever,  chills and appetite change.  HENT: Positive for dental problem. Negative for congestion, drooling, facial swelling, mouth sores, trouble swallowing and voice change.   Musculoskeletal: Negative for neck pain.  Neurological: Negative for headaches.    Allergies  Bee pollen; Darvocet; Erythromycin; Nitrofurantoin monohyd macro; Percocet; Pyridium; and Zithromax  Home Medications   Current Outpatient Rx  Name  Route  Sig  Dispense  Refill  . Diclofenac Sodium (VOLTAREN PO)   Oral   Take by mouth.         Marland Kitchen amoxicillin (AMOXIL) 500 MG capsule   Oral   Take 1 capsule (500 mg total) by mouth 3 (three) times daily.   21 capsule   0   . Cyanocobalamin (VITAMIN B 12 PO)   Oral   Take 1 tablet by mouth daily.         Marland Kitchen dextromethorphan-guaiFENesin (MUCINEX DM) 30-600 MG per 12 hr tablet   Oral   Take 1 tablet by mouth every 12 (twelve) hours.         . Fexofenadine HCl (ALLEGRA PO)   Oral   Take 1 tablet by mouth daily as needed. Patient used this medication for her allergies.         . fish  oil-omega-3 fatty acids 1000 MG capsule   Oral   Take 2 g by mouth daily.         . Fluticasone Propionate (FLONASE NA)   Nasal   Place 1 puff into the nose daily as needed.         . Garcinia Cambogia-Chromium 500-200 MG-MCG TABS   Oral   Take by mouth. Weight loss         . GREEN COFFEE BEAN PO   Oral   Take 1 tablet by mouth daily.         . hydrocortisone cream 1 %   Topical   Apply topically 2 (two) times daily. Rash         . ibuprofen (ADVIL,MOTRIN) 200 MG tablet   Oral   Take 600 mg by mouth every 6 (six) hours as needed. Patient is using this medication for pain in her hand. Pian in back         . ibuprofen (ADVIL,MOTRIN) 600 MG tablet   Oral   Take 1 tablet (600 mg total) by mouth every 6 (six) hours as needed for pain.   30 tablet   0   . mometasone (NASONEX) 50 MCG/ACT nasal spray   Nasal   Place 2 sprays into the nose daily.   17 g   12    . Multiple Vitamin (MULTIVITAMIN) tablet   Oral   Take 1 tablet by mouth daily.         Marland Kitchen oxymetazoline (AFRIN NASAL SPRAY) 0.05 % nasal spray   Nasal   Place 2 sprays into the nose 2 (two) times daily.   30 mL   0   . phenylephrine-shark liver oil-mineral oil-petrolatum (PREPARATION H) 0.25-3-14-71.9 % rectal ointment   Rectal   Place rectally 2 (two) times daily as needed.         Marland Kitchen RASPBERRY PO   Oral   Take 1 tablet by mouth 2 (two) times daily. Patient is using Raspberry Ketones as a dietary supplement.         Marland Kitchen witch hazel-glycerin (TUCKS) pad   Rectal   Place rectally as needed.          BP 132/88  Pulse 102  Temp(Src) 98.6 F (37 C) (Oral)  Resp 20  Ht 5\' 1"  (1.549 m)  Wt 145 lb (65.772 kg)  BMI 27.41 kg/m2  SpO2 100% Physical Exam  Constitutional: She is oriented to person, place, and time. She appears well-developed and well-nourished. No distress.  HENT:  Head: Normocephalic and atraumatic.  Poor dentition throughout.  Second molar on the right lower side is markedly decayed.  There is an area of induration with mild fluctuance just below the tooth consistent with periapical abscess.  Airway is patent.  No pharyngeal edema or erythema  Eyes: Conjunctivae are normal. No scleral icterus.  Neck: Normal range of motion.  Cardiovascular: Normal rate, regular rhythm and normal heart sounds.  Exam reveals no gallop and no friction rub.   No murmur heard. Pulmonary/Chest: Effort normal and breath sounds normal. No respiratory distress.  Abdominal: Soft. Bowel sounds are normal. She exhibits no distension and no mass. There is no tenderness. There is no guarding.  Neurological: She is alert and oriented to person, place, and time.  Skin: Skin is warm and dry. She is not diaphoretic.  Psychiatric: Her behavior is normal.    ED Course  Procedures (including critical care time) Labs Review Labs Reviewed - No data to  display Imaging Review No results  found.  EKG Interpretation   None       MDM  No diagnosis found.  Patient exam consistent with developing periapical abscess.  Negative for blood takes angina, peritonsillar abscess.  The patient will be given amoxicillin and and pain medication.  She has a Education officer, community and will followup with him this coming week. The patient appears reasonably screened and/or stabilized for discharge and I doubt any other medical condition or other [facility] requiring further screening, evaluation, or treatment in the ED at this time prior to discharge.     Arthor Captain, PA-C 04/12/13 2141

## 2013-04-12 NOTE — ED Notes (Signed)
Severe dental decay on lower teeth. Upper teeth all removed currently has denture. Plan is to have all lower teeth remove. Today would like pain management and antibiotic from [place] and to have her dental abscess evaluate

## 2013-04-17 NOTE — ED Provider Notes (Signed)
Medical screening examination/treatment/procedure(s) were performed by non-physician practitioner and as supervising physician I was immediately available for consultation/collaboration.  EKG Interpretation   None         Roney Marion, MD 04/17/13 [phone]

## 2013-05-01 ENCOUNTER — Other Ambulatory Visit (HOSPITAL_COMMUNITY)
Admission: RE | Admit: 2013-05-01 | Discharge: 2013-05-01 | Disposition: A | Discharge status: Home / Self Care | Payer: Medicaid Other | Source: Ambulatory Visit | Attending: Family Medicine | Admitting: Family Medicine

## 2013-05-01 ENCOUNTER — Other Ambulatory Visit: Payer: Self-pay | Admitting: Family Medicine

## 2013-05-01 DIAGNOSIS — Z124 Encounter for screening for malignant neoplasm of cervix: Secondary | ICD-10-CM | POA: Insufficient documentation

## 2014-03-24 ENCOUNTER — Encounter (HOSPITAL_BASED_OUTPATIENT_CLINIC_OR_DEPARTMENT_OTHER): Payer: Self-pay | Admitting: Emergency Medicine

## 2014-04-08 ENCOUNTER — Emergency Department (HOSPITAL_BASED_OUTPATIENT_CLINIC_OR_DEPARTMENT_OTHER): Payer: Medicaid Other

## 2014-04-08 ENCOUNTER — Encounter (HOSPITAL_BASED_OUTPATIENT_CLINIC_OR_DEPARTMENT_OTHER): Payer: Self-pay | Admitting: Emergency Medicine

## 2014-04-08 ENCOUNTER — Emergency Department (HOSPITAL_BASED_OUTPATIENT_CLINIC_OR_DEPARTMENT_OTHER)
Admission: EM | Admit: 2014-04-08 | Discharge: 2014-04-08 | Disposition: A | Discharge status: Home / Self Care | Payer: Medicaid Other | Attending: Emergency Medicine | Admitting: Emergency Medicine

## 2014-04-08 DIAGNOSIS — Z8619 Personal history of other infectious and parasitic diseases: Secondary | ICD-10-CM | POA: Diagnosis not present

## 2014-04-08 DIAGNOSIS — Z7951 Long term (current) use of inhaled steroids: Secondary | ICD-10-CM | POA: Diagnosis not present

## 2014-04-08 DIAGNOSIS — W19XXXA Unspecified fall, initial encounter: Secondary | ICD-10-CM

## 2014-04-08 DIAGNOSIS — Z87448 Personal history of other diseases of urinary system: Secondary | ICD-10-CM | POA: Diagnosis not present

## 2014-04-08 DIAGNOSIS — Z8742 Personal history of other diseases of the female genital tract: Secondary | ICD-10-CM | POA: Diagnosis not present

## 2014-04-08 DIAGNOSIS — Z792 Long term (current) use of antibiotics: Secondary | ICD-10-CM | POA: Diagnosis not present

## 2014-04-08 DIAGNOSIS — Z7952 Long term (current) use of systemic steroids: Secondary | ICD-10-CM | POA: Diagnosis not present

## 2014-04-08 DIAGNOSIS — S3992XA Unspecified injury of lower back, initial encounter: Secondary | ICD-10-CM | POA: Diagnosis not present

## 2014-04-08 DIAGNOSIS — Z87891 Personal history of nicotine dependence: Secondary | ICD-10-CM | POA: Diagnosis not present

## 2014-04-08 DIAGNOSIS — Y998 Other external cause status: Secondary | ICD-10-CM | POA: Insufficient documentation

## 2014-04-08 DIAGNOSIS — W1830XA Fall on same level, unspecified, initial encounter: Secondary | ICD-10-CM | POA: Insufficient documentation

## 2014-04-08 DIAGNOSIS — Y939 Activity, unspecified: Secondary | ICD-10-CM | POA: Insufficient documentation

## 2014-04-08 DIAGNOSIS — Z791 Long term (current) use of non-steroidal anti-inflammatories (NSAID): Secondary | ICD-10-CM | POA: Insufficient documentation

## 2014-04-08 DIAGNOSIS — Y9289 Other specified places as the place of occurrence of the external cause: Secondary | ICD-10-CM | POA: Insufficient documentation

## 2014-04-08 HISTORY — DX: Dorsalgia, unspecified: M54.9

## 2014-04-08 MED ORDER — CYCLOBENZAPRINE HCL 10 MG PO TABS: 10.0000 mg | ORAL_TABLET | Freq: Two times a day (BID) | ORAL | Status: DC | PRN

## 2014-04-08 MED ORDER — HYDROCODONE-ACETAMINOPHEN 5-325 MG PO TABS
1.0000 | ORAL_TABLET | Freq: Once | ORAL | Status: AC
  Administered 2014-04-08: 1 via ORAL
  Filled 2014-04-08: qty 1

## 2014-04-08 MED ORDER — IBUPROFEN 200 MG PO TABS
600.0000 mg | ORAL_TABLET | Freq: Once | ORAL | Status: AC
  Administered 2014-04-08: 22:00:00 600 mg via ORAL
  Filled 2014-04-08 (×2): qty 1

## 2014-04-08 NOTE — ED Provider Notes (Signed)
CSN: [id number]     Arrival date & time 04/08/14  2024 History   First MD Initiated Contact with Patient 04/08/14 2119     Chief Complaint  Patient presents with  . Fall     (Consider location/radiation/quality/duration/timing/severity/associated sxs/prior Treatment) Patient is a 38 y.o. female presenting with fall. The history is provided by the patient.  Fall This is a new problem. The current episode started today. The problem has been gradually worsening.   [name] is a 38 y.o. female who presents to the ED with back pain. She states she fell off the curb and landed on her hands. She states that her left knee buckles on her sometimes and that it happened when she stepped off the curb. She complains of low back pain but states she did not hit her back. She has a history of DDD.  Past Medical History  Diagnosis Date  . Irregular periods/menstrual cycles   . Dysmenorrhea   . Paresthesia of right leg   . Breast cyst     left  . HPV (human papilloma virus) infection   . Pyelonephritis 7/06, 1988  . Back pain    Past Surgical History  Procedure Laterality Date  . Dental surgery    . Cryotherapy     Family History  Problem Relation Age of Onset  . Hypertension Father   . COPD Father   . Depression Father   . Emphysema Father   . Heart murmur Mother   . Cleft lip Sister   . Cancer Maternal Grandmother     lung  . Diabetes Maternal Grandfather    History  Substance Use Topics  . Smoking status: Former Smoker    Quit date: 05/23/2002  . Smokeless tobacco: Never Used  . Alcohol Use: No   OB History    Gravida Para Term Preterm AB TAB SAB Ectopic Multiple Living   2 2 2  0 0 0 0 0 0 2     Review of Systems Negative except as stated in HPI   Allergies  Bee pollen; Darvocet; Erythromycin; Nitrofurantoin monohyd macro; Percocet; Pyridium; and Zithromax  Home Medications   Prior to Admission medications   Medication Sig Start Date End Date Taking?  Authorizing Provider  ALPRAZolam Duanne Moron) 0.5 MG tablet Take 0.5 mg by mouth at bedtime as needed for anxiety.   Yes Historical Provider, MD  buPROPion (WELLBUTRIN SR) 150 MG 12 hr tablet Take 150 mg by mouth 2 (two) times daily.   Yes Historical Provider, MD  amoxicillin (AMOXIL) 500 MG capsule Take 1 capsule (500 mg total) by mouth 3 (three) times daily. 04/12/13   Margarita Mail, PA-C  Cyanocobalamin (VITAMIN B 12 PO) Take 1 tablet by mouth daily.    Historical Provider, MD  dextromethorphan-guaiFENesin (MUCINEX DM) 30-600 MG per 12 hr tablet Take 1 tablet by mouth every 12 (twelve) hours.    Historical Provider, MD  Diclofenac Sodium (VOLTAREN PO) Take by mouth.    Historical Provider, MD  Fexofenadine HCl (ALLEGRA PO) Take 1 tablet by mouth daily as needed. Patient used this medication for her allergies.    Historical Provider, MD  fish oil-omega-3 fatty acids 1000 MG capsule Take 2 g by mouth daily.    Historical Provider, MD  Fluticasone Propionate (FLONASE NA) Place 1 puff into the nose daily as needed.    Historical Provider, MD  Garcinia Cambogia-Chromium 500-200 MG-MCG TABS Take by mouth. Weight loss    Historical Provider, MD  [place]  PO Take 1 tablet by mouth daily.    Historical Provider, MD  HYDROcodone-acetaminophen (NORCO) 5-325 MG per tablet Take 1-2 tablets by mouth every 4 (four) hours as needed. 04/12/13   Margarita Mail, PA-C  hydrocortisone cream 1 % Apply topically 2 (two) times daily. Rash    Historical Provider, MD  ibuprofen (ADVIL,MOTRIN) 200 MG tablet Take 600 mg by mouth every 6 (six) hours as needed. Patient is using this medication for pain in her hand. Pian in back    Historical Provider, MD  ibuprofen (ADVIL,MOTRIN) 600 MG tablet Take 1 tablet (600 mg total) by mouth every 6 (six) hours as needed for pain. 05/09/12   Julianne Rice, MD  mometasone (NASONEX) 50 MCG/ACT nasal spray Place 2 sprays into the nose daily. 05/09/12   Julianne Rice, MD  Multiple  Vitamin (MULTIVITAMIN) tablet Take 1 tablet by mouth daily.    Historical Provider, MD  oxymetazoline (AFRIN NASAL SPRAY) 0.05 % nasal spray Place 2 sprays into the nose 2 (two) times daily. 05/09/12   Julianne Rice, MD  phenylephrine-shark liver oil-mineral oil-petrolatum (PREPARATION H) 0.25-3-14-71.9 % rectal ointment Place rectally 2 (two) times daily as needed.    Historical Provider, MD  RASPBERRY PO Take 1 tablet by mouth 2 (two) times daily. Patient is using Raspberry Ketones as a dietary supplement.    Historical Provider, MD  witch hazel-glycerin (TUCKS) pad Place rectally as needed.    Historical Provider, MD   BP 129/91 mmHg  Pulse 71  Temp(Src) 98 F (36.7 C) (Oral)  Resp 18  Ht 5\' 1"  (1.549 m)  Wt 150 lb (68.04 kg)  BMI 28.36 kg/m2  SpO2 97%  LMP 03/04/2014 Physical Exam  Constitutional: She is oriented to person, place, and time. She appears well-developed and well-nourished. No distress.  HENT:  Head: Normocephalic and atraumatic.  Right Ear: Tympanic membrane normal.  Left Ear: Tympanic membrane normal.  Nose: Nose normal.  Mouth/Throat: Uvula is midline, oropharynx is clear and moist and mucous membranes are normal.  Eyes: EOM are normal.  Neck: Normal range of motion. Neck supple.  Cardiovascular: Normal rate and regular rhythm.   Pulmonary/Chest: Effort normal. She has no wheezes. She has no rales.  Abdominal: Soft. Bowel sounds are normal. There is no tenderness.  Musculoskeletal: Normal range of motion.       Lumbar back: She exhibits tenderness, pain and spasm. She exhibits normal pulse.  Neurological: She is alert and oriented to person, place, and time. She has normal strength. No cranial nerve deficit or sensory deficit. Gait normal.  Reflex Scores:      Bicep reflexes are 2+ on the right side and 2+ on the left side.      Brachioradialis reflexes are 2+ on the right side and 2+ on the left side.      Patellar reflexes are 2+ on the right side and 2+ on  the left side.      Achilles reflexes are 2+ on the right side and 2+ on the left side. Skin: Skin is warm and dry.  Psychiatric: She has a normal mood and affect. Her behavior is normal.  Nursing note and vitals reviewed.   ED Course  Procedures  Dg Lumbar Spine Complete  04/08/2014   CLINICAL DATA:  [place] today injuring the lumbar spine. Pain is of the sacrum and coccyx. History of lumbar spine issues.  EXAM: LUMBAR SPINE - COMPLETE 4+ VIEW  COMPARISON:  None.  FINDINGS: Sacralization of L5. Normal alignment of  the lumbar spine. No vertebral compression deformities. Intervertebral disc space heights are preserved. Normal alignment of the facet joints. No focal bone lesion or bone destruction. Bone cortex and trabecular architecture appear intact. Visualized SI joints appear symmetrical. Visualized sacrum appears intact.  IMPRESSION: Sacralization of L5.  No acute bony abnormalities.   Electronically Signed   By: Lucienne Capers M.D.   On: 04/08/2014 22:12    MDM  38 y.o. female with low back pain s/p fall. I have reviewed this patient's vital signs, nurses notes, appropriate imaging and discussed findings and plan of care with the patient. She voices understanding and agrees with plan. Stable for discharge without neuro deficits.    Medication List    TAKE these medications        cyclobenzaprine 10 MG tablet  Commonly known as:  FLEXERIL  Take 1 tablet (10 mg total) by mouth 2 (two) times daily as needed for muscle spasms.      ASK your doctor about these medications        ALLEGRA PO  Take 1 tablet by mouth daily as needed. Patient used this medication for her allergies.     ALPRAZolam 0.5 MG tablet  Commonly known as:  XANAX  Take 0.5 mg by mouth at bedtime as needed for anxiety.     amoxicillin 500 MG capsule  Commonly known as:  AMOXIL  Take 1 capsule (500 mg total) by mouth 3 (three) times daily.     buPROPion 150 MG 12 hr tablet  Commonly known as:  WELLBUTRIN SR   Take 150 mg by mouth 2 (two) times daily.     dextromethorphan-guaiFENesin 30-600 MG per 12 hr tablet  Commonly known as:  MUCINEX DM  Take 1 tablet by mouth every 12 (twelve) hours.     fish oil-omega-3 fatty acids 1000 MG capsule  Take 2 g by mouth daily.     FLONASE NA  Place 1 puff into the nose daily as needed.     Garcinia Cambogia-Chromium 500-200 MG-MCG Tabs  Take by mouth. Weight loss     GREEN COFFEE BEAN PO  Take 1 tablet by mouth daily.     HYDROcodone-acetaminophen 5-325 MG per tablet  Commonly known as:  NORCO  Take 1-2 tablets by mouth every 4 (four) hours as needed.     hydrocortisone cream 1 %  Apply topically 2 (two) times daily. Rash     ibuprofen 200 MG tablet  Commonly known as:  ADVIL,MOTRIN  Take 600 mg by mouth every 6 (six) hours as needed. Patient is using this medication for pain in her hand. Pian in back     ibuprofen 600 MG tablet  Commonly known as:  ADVIL,MOTRIN  Take 1 tablet (600 mg total) by mouth every 6 (six) hours as needed for pain.     mometasone 50 MCG/ACT nasal spray  Commonly known as:  NASONEX  Place 2 sprays into the nose daily.     multivitamin tablet  Take 1 tablet by mouth daily.     oxymetazoline 0.05 % nasal spray  Commonly known as:  AFRIN NASAL SPRAY  Place 2 sprays into the nose 2 (two) times daily.     phenylephrine-shark liver oil-mineral oil-petrolatum 0.25-3-14-71.9 % rectal ointment  Commonly known as:  PREPARATION H  Place rectally 2 (two) times daily as needed.     RASPBERRY PO  Take 1 tablet by mouth 2 (two) times daily. Patient is using Raspberry Ketones as a dietary  supplement.     VITAMIN B 12 PO  Take 1 tablet by mouth daily.     VOLTAREN PO  Take by mouth.     witch hazel-glycerin pad  Commonly known as:  TUCKS  Place rectally as needed.            [address] [place], [place] 04/08/14 5300  Debby Freiberg, MD 04/09/14 [phone]

## 2014-04-08 NOTE — ED Notes (Signed)
Pt states she fell off the curb and landed on hands but back is hurting

## 2014-05-23 DIAGNOSIS — C50912 Malignant neoplasm of unspecified site of left female breast: Secondary | ICD-10-CM

## 2014-05-23 HISTORY — DX: Malignant neoplasm of unspecified site of left female breast: C50.912

## 2014-06-10 ENCOUNTER — Emergency Department (HOSPITAL_BASED_OUTPATIENT_CLINIC_OR_DEPARTMENT_OTHER)
Admission: EM | Admit: 2014-06-10 | Discharge: 2014-06-10 | Disposition: A | Discharge status: Home / Self Care | Payer: Medicaid Other | Attending: Emergency Medicine | Admitting: Emergency Medicine

## 2014-06-10 ENCOUNTER — Encounter (HOSPITAL_BASED_OUTPATIENT_CLINIC_OR_DEPARTMENT_OTHER): Payer: Self-pay | Admitting: *Deleted

## 2014-06-10 ENCOUNTER — Emergency Department (HOSPITAL_BASED_OUTPATIENT_CLINIC_OR_DEPARTMENT_OTHER): Payer: Medicaid Other

## 2014-06-10 DIAGNOSIS — Z791 Long term (current) use of non-steroidal anti-inflammatories (NSAID): Secondary | ICD-10-CM | POA: Insufficient documentation

## 2014-06-10 DIAGNOSIS — R05 Cough: Secondary | ICD-10-CM | POA: Insufficient documentation

## 2014-06-10 DIAGNOSIS — Z87891 Personal history of nicotine dependence: Secondary | ICD-10-CM | POA: Insufficient documentation

## 2014-06-10 DIAGNOSIS — Z79899 Other long term (current) drug therapy: Secondary | ICD-10-CM | POA: Insufficient documentation

## 2014-06-10 DIAGNOSIS — Z8742 Personal history of other diseases of the female genital tract: Secondary | ICD-10-CM | POA: Diagnosis not present

## 2014-06-10 DIAGNOSIS — Z7951 Long term (current) use of inhaled steroids: Secondary | ICD-10-CM | POA: Insufficient documentation

## 2014-06-10 DIAGNOSIS — Z8619 Personal history of other infectious and parasitic diseases: Secondary | ICD-10-CM | POA: Diagnosis not present

## 2014-06-10 DIAGNOSIS — Z7952 Long term (current) use of systemic steroids: Secondary | ICD-10-CM | POA: Insufficient documentation

## 2014-06-10 DIAGNOSIS — R059 Cough, unspecified: Secondary | ICD-10-CM

## 2014-06-10 DIAGNOSIS — Z792 Long term (current) use of antibiotics: Secondary | ICD-10-CM | POA: Insufficient documentation

## 2014-06-10 MED ORDER — BENZONATATE 100 MG PO CAPS: 100.0000 mg | ORAL_CAPSULE | Freq: Three times a day (TID) | ORAL | Status: DC

## 2014-06-10 NOTE — Discharge Instructions (Signed)
Cough, Adult   A cough is a reflex. It helps you clear your throat and airways. A cough can help heal your body. A cough can last 2 or 3 weeks (acute) or may last more than 8 weeks (chronic). Some common causes of a cough can include an infection, allergy, or a cold.  HOME CARE  · Only take medicine as told by your doctor.  · If given, take your medicines (antibiotics) as told. Finish them even if you start to feel better.  · Use a cold steam vaporizer or humidifier in your home. This can help loosen thick spit (secretions).  · Sleep so you are almost sitting up (semi-upright). Use pillows to do this. This helps reduce coughing.  · Rest as needed.  · Stop smoking if you smoke.  GET HELP RIGHT AWAY IF:  · You have yellowish-white fluid (pus) in your thick spit.  · Your cough gets worse.  · Your medicine does not reduce coughing, and you are losing sleep.  · You cough up blood.  · You have trouble breathing.  · Your pain gets worse and medicine does not help.  · You have a fever.  MAKE SURE YOU:   · Understand these instructions.  · Will watch your condition.  · Will get help right away if you are not doing well or get worse.  Document Released: 01/20/2011 Document Revised: 09/23/2013 Document Reviewed: 01/20/2011  [employer]® Patient Information ©2015 [employer]. This information is not intended to replace advice given to you by your health care provider. Make sure you discuss any questions you have with your health care provider.

## 2014-06-10 NOTE — ED Notes (Signed)
Pt amb to room 12, reports cough x 2 weeks, seen by her pcp and told "allergies", was told to get a neti pot and do sinus washes, pt states she cannot afford one. Pt states she is allergic to cigarette smoke, and she has been around her sig other while he has been smoking. Pt denies any fevers, reports cough produces yellowish sputum.

## 2014-06-10 NOTE — ED Provider Notes (Signed)
CSN: [id number]     Arrival date & time 06/10/14  0906 History   First MD Initiated Contact with Patient 06/10/14 [phone]     Chief Complaint  Patient presents with  . Cough     (Consider location/radiation/quality/duration/timing/severity/associated sxs/prior Treatment) HPI Comments: The patient is a 39 year old female who presents with a gradual onset of a cough which has been present for 2 weeks. Nothing makes this better or worse, she has had no medications prior to arrival, she denies any smoking history over the last 10 years but does find herself around a significant other who is smoking cigarettes. This does make her cough and she feels allergic to it. She denies starting any new medications, she is not on any antihypertensives, she does not have a sore throat, headache, body aches, fever, chills, nausea, vomiting, chest pain, back pain, swelling, rashes or any other complaints.  Patient is a 39 y.o. female presenting with cough. The history is provided by the patient.  Cough   Past Medical History  Diagnosis Date  . Irregular periods/menstrual cycles   . Dysmenorrhea   . Paresthesia of right leg   . Breast cyst     left  . HPV (human papilloma virus) infection   . Pyelonephritis 7/06, 1988  . Back pain    Past Surgical History  Procedure Laterality Date  . Dental surgery    . Cryotherapy     Family History  Problem Relation Age of Onset  . Hypertension Father   . COPD Father   . Depression Father   . Emphysema Father   . Heart murmur Mother   . Cleft lip Sister   . Cancer Maternal Grandmother     lung  . Diabetes Maternal Grandfather    History  Substance Use Topics  . Smoking status: Former Smoker    Quit date: 05/23/2002  . Smokeless tobacco: Never Used  . Alcohol Use: No   OB History    Gravida Para Term Preterm AB TAB SAB Ectopic Multiple Living   2 2 2  0 0 0 0 0 0 2     Review of Systems  Respiratory: Positive for cough.   All other systems  reviewed and are negative.     Allergies  Bee pollen; Darvocet; Erythromycin; Nitrofurantoin monohyd macro; Percocet; Pyridium; and Zithromax  Home Medications   Prior to Admission medications   Medication Sig Start Date End Date Taking? Authorizing Provider  amphetamine-dextroamphetamine (ADDERALL XR) 20 MG 24 hr capsule Take 20 mg by mouth daily.   Yes Historical Provider, MD  ALPRAZolam Duanne Moron) 0.5 MG tablet Take 0.5 mg by mouth at bedtime as needed for anxiety.    Historical Provider, MD  amoxicillin (AMOXIL) 500 MG capsule Take 1 capsule (500 mg total) by mouth 3 (three) times daily. 04/12/13   Margarita Mail, PA-C  benzonatate (TESSALON) 100 MG capsule Take 1 capsule (100 mg total) by mouth every 8 (eight) hours. 06/10/14   Johnna Acosta, MD  buPROPion (WELLBUTRIN SR) 150 MG 12 hr tablet Take 150 mg by mouth 2 (two) times daily.    Historical Provider, MD  Cyanocobalamin (VITAMIN B 12 PO) Take 1 tablet by mouth daily.    Historical Provider, MD  cyclobenzaprine (FLEXERIL) 10 MG tablet Take 1 tablet (10 mg total) by mouth 2 (two) times daily as needed for muscle spasms. 04/08/14   [place], NP  dextromethorphan-guaiFENesin (MUCINEX DM) 30-600 MG per 12 hr tablet Take 1 tablet by mouth every  12 (twelve) hours.    Historical Provider, MD  Diclofenac Sodium (VOLTAREN PO) Take by mouth.    Historical Provider, MD  Fexofenadine HCl (ALLEGRA PO) Take 1 tablet by mouth daily as needed. Patient used this medication for her allergies.    Historical Provider, MD  fish oil-omega-3 fatty acids 1000 MG capsule Take 2 g by mouth daily.    Historical Provider, MD  Fluticasone Propionate (FLONASE NA) Place 1 puff into the nose daily as needed.    Historical Provider, MD  Garcinia Cambogia-Chromium 500-200 MG-MCG TABS Take by mouth. Weight loss    Historical Provider, MD  GREEN COFFEE BEAN PO Take 1 tablet by mouth daily.    Historical Provider, MD  HYDROcodone-acetaminophen (NORCO) 5-325 MG per  tablet Take 1-2 tablets by mouth every 4 (four) hours as needed. 04/12/13   Margarita Mail, PA-C  hydrocortisone cream 1 % Apply topically 2 (two) times daily. Rash    Historical Provider, MD  ibuprofen (ADVIL,MOTRIN) 200 MG tablet Take 600 mg by mouth every 6 (six) hours as needed. Patient is using this medication for pain in her hand. Pian in back    Historical Provider, MD  ibuprofen (ADVIL,MOTRIN) 600 MG tablet Take 1 tablet (600 mg total) by mouth every 6 (six) hours as needed for pain. 05/09/12   Julianne Rice, MD  mometasone (NASONEX) 50 MCG/ACT nasal spray Place 2 sprays into the nose daily. 05/09/12   Julianne Rice, MD  Multiple Vitamin (MULTIVITAMIN) tablet Take 1 tablet by mouth daily.    Historical Provider, MD  oxymetazoline (AFRIN NASAL SPRAY) 0.05 % nasal spray Place 2 sprays into the nose 2 (two) times daily. 05/09/12   Julianne Rice, MD  phenylephrine-shark liver oil-mineral oil-petrolatum (PREPARATION H) 0.25-3-14-71.9 % rectal ointment Place rectally 2 (two) times daily as needed.    Historical Provider, MD  RASPBERRY PO Take 1 tablet by mouth 2 (two) times daily. Patient is using Raspberry Ketones as a dietary supplement.    Historical Provider, MD  witch hazel-glycerin (TUCKS) pad Place rectally as needed.    Historical Provider, MD   BP 132/72 mmHg  Pulse 79  Temp(Src) 98 F (36.7 C) (Oral)  Resp 20  Ht 5\' 1"  (1.549 m)  Wt 150 lb (68.04 kg)  BMI 28.36 kg/m2  SpO2 99%  LMP 05/20/2014 Physical Exam  Constitutional: She appears well-developed and well-nourished. No distress.  HENT:  Head: Normocephalic and atraumatic.  Mouth/Throat: Oropharynx is clear and moist. No oropharyngeal exudate.  Normocephalic, atraumatic, clear oropharynx without any swelling, exudate, asymmetry, hypertrophy or erythema.  Moist mucous membranes, clear nasal passages, normal tympanic membranes without any effusions, erythema or opacification.  Eyes: Conjunctivae and EOM are normal. Pupils  are equal, round, and reactive to light. Right eye exhibits no discharge. Left eye exhibits no discharge. No scleral icterus.  Neck: Normal range of motion. Neck supple. No JVD present. No thyromegaly present.  Cardiovascular: Normal rate, regular rhythm, normal heart sounds and intact distal pulses.  Exam reveals no gallop and no friction rub.   No murmur heard. Pulmonary/Chest: Effort normal and breath sounds normal. No respiratory distress. She has no wheezes. She has no rales.  Abdominal: Soft. Bowel sounds are normal. She exhibits no distension and no mass. There is no tenderness.  Musculoskeletal: Normal range of motion. She exhibits no edema or tenderness.  Lymphadenopathy:    She has no cervical adenopathy.  Neurological: She is alert. Coordination normal.  Skin: Skin is warm and dry. No rash  noted. No erythema.  Psychiatric: She has a normal mood and affect. Her behavior is normal.  Nursing note and vitals reviewed.   ED Course  Procedures (including critical care time) Labs Review Labs Reviewed - No data to display  Imaging Review Dg Chest 2 View  06/10/2014   CLINICAL DATA:  Cough  EXAM: CHEST  2 VIEW  COMPARISON:  08/29/2012  FINDINGS: The heart size and mediastinal contours are within normal limits. Both lungs are clear. The visualized skeletal structures are unremarkable.  IMPRESSION: No active cardiopulmonary disease.   Electronically Signed   By: Franchot Gallo M.D.   On: 06/10/2014 10:00      MDM   Final diagnoses:  Cough    The patient's exam is totally normal, there is no wheezing rales or rhonchi, she is not in any respiratory distress, normal work of breathing, normal lung sounds, normal vital signs. She is exposed to cigarette smoke, I suspect this is the source, chest x-ray ordered to rule out other sources including lung cancer, bronchiectasis, pneumonia, pneumothorax. Anticipate discharge home given the patient's benign appearance if x-ray is supportive of a  benign diagnosis.  Xray neg  Meds given in ED:  Medications - No data to display  Discharge Medication List as of 06/10/2014 10:11 AM    START taking these medications   Details  benzonatate (TESSALON) 100 MG capsule Take 1 capsule (100 mg total) by mouth every 8 (eight) hours., Starting 06/10/2014, Until Discontinued, Print            Johnna Acosta, MD 06/10/14 [phone]

## 2015-02-13 ENCOUNTER — Other Ambulatory Visit: Payer: Self-pay | Admitting: Medical Oncology

## 2015-02-13 ENCOUNTER — Other Ambulatory Visit: Payer: Self-pay | Admitting: Internal Medicine

## 2015-02-13 DIAGNOSIS — N6320 Unspecified lump in the left breast, unspecified quadrant: Secondary | ICD-10-CM

## 2015-02-13 DIAGNOSIS — N6313 Unspecified lump in the right breast, lower outer quadrant: Secondary | ICD-10-CM

## 2015-02-13 DIAGNOSIS — N6312 Unspecified lump in the right breast, upper inner quadrant: Secondary | ICD-10-CM

## 2015-02-13 DIAGNOSIS — N6310 Unspecified lump in the right breast, unspecified quadrant: Secondary | ICD-10-CM

## 2015-02-13 DIAGNOSIS — N6452 Nipple discharge: Secondary | ICD-10-CM

## 2015-02-13 DIAGNOSIS — N6315 Unspecified lump in the right breast, overlapping quadrants: Secondary | ICD-10-CM

## 2015-02-13 DIAGNOSIS — N6314 Unspecified lump in the right breast, lower inner quadrant: Secondary | ICD-10-CM

## 2015-02-17 ENCOUNTER — Other Ambulatory Visit: Payer: Self-pay | Admitting: Medical Oncology

## 2015-02-17 ENCOUNTER — Ambulatory Visit
Admission: RE | Admit: 2015-02-17 | Discharge: 2015-02-17 | Disposition: A | Discharge status: Home / Self Care | Payer: BLUE CROSS/BLUE SHIELD | Source: Ambulatory Visit | Attending: Medical Oncology | Admitting: Medical Oncology

## 2015-02-17 DIAGNOSIS — N6314 Unspecified lump in the right breast, lower inner quadrant: Secondary | ICD-10-CM

## 2015-02-17 DIAGNOSIS — N6315 Unspecified lump in the right breast, overlapping quadrants: Secondary | ICD-10-CM

## 2015-02-17 DIAGNOSIS — N6310 Unspecified lump in the right breast, unspecified quadrant: Secondary | ICD-10-CM

## 2015-02-17 DIAGNOSIS — N6320 Unspecified lump in the left breast, unspecified quadrant: Secondary | ICD-10-CM

## 2015-02-17 DIAGNOSIS — N6452 Nipple discharge: Secondary | ICD-10-CM

## 2015-02-17 DIAGNOSIS — N6313 Unspecified lump in the right breast, lower outer quadrant: Secondary | ICD-10-CM

## 2015-02-17 DIAGNOSIS — N6312 Unspecified lump in the right breast, upper inner quadrant: Secondary | ICD-10-CM

## 2015-02-18 ENCOUNTER — Ambulatory Visit
Admission: RE | Admit: 2015-02-18 | Discharge: 2015-02-18 | Disposition: A | Discharge status: Home / Self Care | Payer: BLUE CROSS/BLUE SHIELD | Source: Ambulatory Visit | Attending: Medical Oncology | Admitting: Medical Oncology

## 2015-02-18 DIAGNOSIS — N6310 Unspecified lump in the right breast, unspecified quadrant: Secondary | ICD-10-CM

## 2015-02-18 DIAGNOSIS — N6320 Unspecified lump in the left breast, unspecified quadrant: Secondary | ICD-10-CM

## 2015-02-18 DIAGNOSIS — N6313 Unspecified lump in the right breast, lower outer quadrant: Secondary | ICD-10-CM

## 2015-02-18 DIAGNOSIS — N6312 Unspecified lump in the right breast, upper inner quadrant: Secondary | ICD-10-CM

## 2015-02-18 DIAGNOSIS — N6452 Nipple discharge: Secondary | ICD-10-CM

## 2015-02-18 DIAGNOSIS — N6314 Unspecified lump in the right breast, lower inner quadrant: Secondary | ICD-10-CM

## 2015-02-18 DIAGNOSIS — N6315 Unspecified lump in the right breast, overlapping quadrants: Secondary | ICD-10-CM

## 2015-02-23 ENCOUNTER — Other Ambulatory Visit: Payer: Self-pay | Admitting: Surgery

## 2015-02-23 DIAGNOSIS — D0512 Intraductal carcinoma in situ of left breast: Secondary | ICD-10-CM

## 2015-02-24 ENCOUNTER — Telehealth: Payer: Self-pay | Admitting: *Deleted

## 2015-02-24 NOTE — Telephone Encounter (Signed)
Received referral from [facility].  Called pt and confirmed 03/02/15 genetic appt and 03/02/15 Dr. Lindi Adie appt w/ pt.  Unable to mail packet - gave verbal, directions, instructions and placed a note for an intake form to be given to the pt at time of check in.  Emailed Shavon and Aimee at [facility] to make them aware.  Called and spoke with Kieth Brightly at pt's PCP and obtained NPI # [id number] for appts.  Placed a copy of the records in Dr. Geralyn Flash box and took one to  [facility] to scan.

## 2015-02-26 ENCOUNTER — Ambulatory Visit
Admission: RE | Admit: 2015-02-26 | Discharge: 2015-02-26 | Disposition: A | Discharge status: Home / Self Care | Payer: BLUE CROSS/BLUE SHIELD | Source: Ambulatory Visit | Attending: Surgery | Admitting: Surgery

## 2015-02-26 DIAGNOSIS — D0512 Intraductal carcinoma in situ of left breast: Secondary | ICD-10-CM

## 2015-02-26 MED ORDER — GADOBENATE DIMEGLUMINE 529 MG/ML IV SOLN: 15.0000 mL | Freq: Once | INTRAVENOUS | Status: DC | PRN

## 2015-02-27 ENCOUNTER — Other Ambulatory Visit: Payer: Self-pay | Admitting: Surgery

## 2015-02-27 DIAGNOSIS — D0512 Intraductal carcinoma in situ of left breast: Secondary | ICD-10-CM

## 2015-03-02 ENCOUNTER — Ambulatory Visit (HOSPITAL_BASED_OUTPATIENT_CLINIC_OR_DEPARTMENT_OTHER): Payer: BLUE CROSS/BLUE SHIELD | Admitting: Genetic Counselor

## 2015-03-02 ENCOUNTER — Encounter: Payer: Self-pay | Admitting: Hematology and Oncology

## 2015-03-02 ENCOUNTER — Encounter: Payer: Self-pay | Admitting: Genetic Counselor

## 2015-03-02 ENCOUNTER — Ambulatory Visit (HOSPITAL_BASED_OUTPATIENT_CLINIC_OR_DEPARTMENT_OTHER): Payer: BLUE CROSS/BLUE SHIELD | Admitting: Hematology and Oncology

## 2015-03-02 ENCOUNTER — Encounter: Payer: Self-pay | Admitting: *Deleted

## 2015-03-02 ENCOUNTER — Other Ambulatory Visit: Payer: BLUE CROSS/BLUE SHIELD

## 2015-03-02 VITALS — BP 129/92 | HR 83 | Temp 98.2°F | Resp 20 | Ht 61.0 in | Wt 164.4 lb

## 2015-03-02 DIAGNOSIS — C50912 Malignant neoplasm of unspecified site of left female breast: Secondary | ICD-10-CM

## 2015-03-02 DIAGNOSIS — D0512 Intraductal carcinoma in situ of left breast: Secondary | ICD-10-CM

## 2015-03-02 DIAGNOSIS — Z171 Estrogen receptor negative status [ER-]: Secondary | ICD-10-CM | POA: Diagnosis not present

## 2015-03-02 DIAGNOSIS — Z801 Family history of malignant neoplasm of trachea, bronchus and lung: Secondary | ICD-10-CM

## 2015-03-02 DIAGNOSIS — Z808 Family history of malignant neoplasm of other organs or systems: Secondary | ICD-10-CM

## 2015-03-02 DIAGNOSIS — C50919 Malignant neoplasm of unspecified site of unspecified female breast: Secondary | ICD-10-CM | POA: Insufficient documentation

## 2015-03-02 DIAGNOSIS — C50412 Malignant neoplasm of upper-outer quadrant of left female breast: Secondary | ICD-10-CM | POA: Insufficient documentation

## 2015-03-02 DIAGNOSIS — Z315 Encounter for genetic counseling: Secondary | ICD-10-CM | POA: Diagnosis not present

## 2015-03-02 NOTE — Progress Notes (Signed)
REFERRING PROVIDER: Darcus Austin, MD [place], [place] [address]   Nicholas Lose, MD  PRIMARY PROVIDER:  Adline Mango, MD  PRIMARY REASON FOR VISIT:  1. 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 female, was seen for a [place] cancer genetics consultation at the request of Dr. Inda Merlin due to a personal history of cancer.  [name] presents to clinic today to discuss the possibility of a hereditary predisposition to cancer, genetic testing, and to further clarify her future cancer risks, as well as potential cancer risks for family members.   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  She has a biopsy scheduled for Wed on her right breast and another biopsy scheduled for Friday on a lymph node.   These biopsies will help determine if she will need a unilateral or double mastectomy and whether chemo will be involved.   CANCER HISTORY:    Breast cancer of upper-outer quadrant of left female breast ([place])   02/18/2015 Mammogram Left breast calcs10 x 12.5 x 9.5 cm U/S 4.5 cm mass, prominent left axillary lymph node, dense breast. Right breast: masses in the subareolar region and in UOQ benign appearing calcifications, U/S multiple cysts 1.4 cm   02/18/2015 Initial Diagnosis Left breast biopsies (anterior and posterior) 12:00: DCIS with necrosis and calcifications ER 0%, PR 0%, high-grade     HORMONAL RISK FACTORS:  Menarche was at age 18.  First live birth at age 61.  OCP use for approximately used depoprovera shots.  Ovaries intact: yes.  Hysterectomy: no.  Menopausal status: premenopausal.  HRT use: 0 years. Colonoscopy: no; not examined. Mammogram within the last year: yes. Number of breast biopsies: 1. Up to date with pelvic exams:  yes. Any excessive radiation exposure in the past:  no  Past Medical  History  Diagnosis Date  . Irregular periods/menstrual cycles   . Dysmenorrhea   . Paresthesia of right leg   . Breast cyst     left  . HPV (human papilloma virus) infection   . Pyelonephritis 7/06, 1988  . Back pain   . Brain cancer [facility]) 2016    Left DCIS - ER-/PR-    Past Surgical History  Procedure Laterality Date  . Dental surgery    . Cryotherapy      Social History   Social History  . Marital Status: Married    Spouse Name: N/A  . Number of Children: 2  . Years of Education: N/A   Social History Main Topics  . Smoking status: Former Smoker    Quit date: 05/23/2002  . Smokeless tobacco: Never Used  . Alcohol Use: No  . Drug Use: No  . Sexual Activity: Yes    Birth Control/ Protection: Condom   Other Topics Concern  . None   Social History Narrative     FAMILY HISTORY:  We obtained a detailed, 4-generation family history.  Significant diagnoses are listed below: Family History  Problem Relation Age of Onset  . Hypertension Father   . COPD Father   . Depression Father   . Emphysema Father   . Heart murmur Mother   . Cleft lip Sister   . Lung cancer Maternal Grandmother     dx in her 43s, smoker  . Diabetes Maternal Grandfather   .  Heart Problems Maternal Uncle   . Lung cancer Paternal Grandfather   . Lung cancer Other     MGMs sister  . Melanoma Other     MGMs sister  . Brain cancer Cousin 11    maternal cousin's daughter   The patient has two boys, and one sister none of whom have cancer.  Both parents are alive, her mother is 56 and a smoker and her father is 64 and a smoker.  Her mother has one brother who has a granddaughter who is 70 with a brain tumor.  Her maternal grandparents are both deceased.  Her grandfather from comlications of diabetes, and her grandmother from lung cancer.  Her grandmother had one sister who died from lung cancer and was a smoker, and one sister who recently died from [place].  Her father had two brothers who are  cancer free.  Her paternal grandparents are deceased, her grandfather from lung cancer and her grandmother from a MVA.  Patient's maternal ancestors are of Caucasian descent, and paternal ancestors are of Caucasian and [place] descent. There is no reported Ashkenazi Jewish ancestry. There is no known consanguinity.  GENETIC COUNSELING ASSESSMENT: [name] is a 39 y.o. female with a personal history of breast cancer which somewhat suggestive of a hereditary cancer syndrome and predisposition to cancer. We, therefore, discussed and recommended the following at today's visit.   DISCUSSION: We discussed hereditary cancer syndromes affect about 5-10% of individuals with breast cancer.  She has a limited family history on her paternal side, and therefore the lack of cancer in the family does not place her at a lower risk for a hereditary cancer syndrome.  She will get more information about her maternal cousin's child with the brain tumor.  We reviewed the characteristics, features and inheritance patterns of hereditary cancer syndromes. We also discussed genetic testing, including the appropriate family members to test, the process of testing, insurance coverage and turn-around-time for results. We discussed the implications of a negative, positive and/or variant of uncertain significant result. We recommended [name] pursue genetic testing for the Breast/OVarian gene panel. The Breast/Ovarian gene panel offered by [facility] includes sequencing and rearrangement analysis for the following 20 genes:  ATM, BARD1, BRCA1, BRCA2, BRIP1, CDH1, CHEK2, EPCAM, FANCC, MLH1, MSH2, MSH6, NBN, PALB2, PMS2, PTEN, RAD51C, RAD51D, TP53, and XRCC2.     Based on Ms. Gubser's personal history of cancer, she meets medical criteria for genetic testing. Despite that she meets criteria, she may still have an out of pocket cost. We discussed that if her out of pocket cost for testing is over $100, the laboratory will call and  confirm whether she wants to proceed with testing.  If the out of pocket cost of testing is less than $100 she will be billed by the genetic testing laboratory.   PLAN: After considering the risks, benefits, and limitations, Ms. Hochberg  provided informed consent to pursue genetic testing and the blood sample was sent to [facility] for analysis of the [facility] cancer panel. Results should be available within approximately 2-3 weeks' time, at which point they will be disclosed by telephone to Ms. Abee, as will any additional recommendations warranted by these results. Ms. Staten will receive a summary of her genetic counseling visit and a copy of her results once available. This information will also be available in Epic. We encouraged Ms. Baudoin to remain in contact with cancer genetics annually so that we can continuously update the family history and  inform her of any changes in cancer genetics and testing that may be of benefit for her family. Ms. Reichl questions were answered to her satisfaction today. Our contact information was provided should additional questions or concerns arise.  Lastly, we encouraged Ms. Biller to remain in contact with cancer genetics annually so that we can continuously update the family history and inform her of any changes in cancer genetics and testing that may be of benefit for this family.   Ms.  Fay questions were answered to her satisfaction today. Our contact information was provided should additional questions or concerns arise. Thank you for the referral and allowing [place] to share in the care of your patient.   Karen P. Florene Glen, [place], [facility] Certified Genetic Counselor [email or web address] phone: [phone]  The patient was seen for a total of 45 minutes in face-to-face genetic counseling.  This patient was discussed with Drs. Magrinat, Lindi Adie and/or Burr Medico who agrees with the above.     _______________________________________________________________________ For Office Staff:  Number of people involved in session: 3 Was an Intern/ student involved with case: no

## 2015-03-02 NOTE — Progress Notes (Addendum)
[place] NOTE  Patient Care Team: Cleone Slim. Rozetta Nunnery, MD as PCP - General (Internal Medicine)  CHIEF COMPLAINTS/PURPOSE OF CONSULTATION:  Newly diagnosed breast cancer  HISTORY OF PRESENTING ILLNESS:  [name] 39 y.o. female is here because of recent diagnosis of left breast DCIS. She initially presented with bilateral breast pain. Left breast pain was worse over the past 3 months. She also noticed a lump in the left breast. There was also some brownish drainage. She underwent a mammogram on 02/18/2015 that showed extensive calcification throughout the left breast measuring 12.5 cm. On the right breast there were some possible masses in the subareolar region. Ultrasound of the breasts was performed on both sides. Left breast showed an irregular mass 4.5 cm in size. There were also prominent left axillary lymph nodes with a 4 mm thickened cortex. Right breast ultrasound was otherwise negative but had some cysts. She underwent a biopsy on 02/18/2015 both the biopsies showed evidence of DCIS with necrosis and calcifications. ER PR 0% high-grade. She was sent was to discuss a treatment plan.  I reviewed her records extensively and collaborated the history with the patient.  SUMMARY OF ONCOLOGIC HISTORY:   Breast cancer of upper-outer quadrant of left female breast ([place])   02/18/2015 Mammogram Left breast calcs10 x 12.5 x 9.5 cm U/S 4.5 cm mass, prominent left axillary lymph node, dense breast. Right breast: masses in the subareolar region and in UOQ benign appearing calcifications, U/S multiple cysts 1.4 cm   02/18/2015 Initial Diagnosis Left breast biopsies (anterior and posterior) 12:00: DCIS with necrosis and calcifications ER 0%, PR 0%, high-grade    In terms of breast cancer risk profile:  She menarched at early age of 39  She had 2 pregnancy, her first child was born at age   She has not received birth control pills but used Depo-Provera.  She was never exposed to  fertility medications or hormone replacement therapy.  She has no family history of Breast/GYN/GI cancer  MEDICAL HISTORY:  Past Medical History  Diagnosis Date  . Irregular periods/menstrual cycles   . Dysmenorrhea   . Paresthesia of right leg   . Breast cyst     left  . HPV (human papilloma virus) infection   . Pyelonephritis 7/06, 1988  . Back pain   . Brain cancer ([place]) 2016    Left DCIS - ER-/PR-    SURGICAL HISTORY: Past Surgical History  Procedure Laterality Date  . Dental surgery    . Cryotherapy      SOCIAL HISTORY: Social History   Social History  . Marital Status: Married    Spouse Name: N/A  . Number of Children: 2  . Years of Education: N/A   Occupational History  . Not on file.   Social History Main Topics  . Smoking status: Former Smoker    Quit date: 05/23/2002  . Smokeless tobacco: Never Used  . Alcohol Use: No  . Drug Use: No  . Sexual Activity: Yes    Birth Control/ Protection: Condom   Other Topics Concern  . Not on file   Social History Narrative    FAMILY HISTORY: Family History  Problem Relation Age of Onset  . Hypertension Father   . COPD Father   . Depression Father   . Emphysema Father   . Heart murmur Mother   . Cleft lip Sister   . Lung cancer Maternal Grandmother     dx in her 37s, smoker  . Diabetes  Maternal Grandfather   . Heart Problems Maternal Uncle   . Lung cancer Paternal Grandfather   . Lung cancer Other     MGMs sister  . Melanoma Other     MGMs sister  . Brain cancer Cousin 11    maternal cousin's daughter    ALLERGIES:  is allergic to bee pollen; darvocet; erythromycin; nitrofurantoin monohyd macro; percocet; pyridium; and zithromax.  MEDICATIONS:  Current Outpatient Prescriptions  Medication Sig Dispense Refill  . ALPRAZolam (XANAX) 0.5 MG tablet Take 0.5 mg by mouth at bedtime as needed for anxiety.    Marland Kitchen amoxicillin (AMOXIL) 500 MG capsule Take 1 capsule (500 mg total) by mouth 3 (three) times  daily. 21 capsule 0  . amphetamine-dextroamphetamine (ADDERALL XR) 20 MG 24 hr capsule Take 20 mg by mouth daily.    . benzonatate (TESSALON) 100 MG capsule Take 1 capsule (100 mg total) by mouth every 8 (eight) hours. 21 capsule 0  . buPROPion (WELLBUTRIN SR) 150 MG 12 hr tablet Take 150 mg by mouth 2 (two) times daily.    . Cyanocobalamin (VITAMIN B 12 PO) Take 1 tablet by mouth daily.    . cyclobenzaprine (FLEXERIL) 10 MG tablet Take 1 tablet (10 mg total) by mouth 2 (two) times daily as needed for muscle spasms. 20 tablet 0  . dextromethorphan-guaiFENesin (MUCINEX DM) 30-600 MG per 12 hr tablet Take 1 tablet by mouth every 12 (twelve) hours.    . Diclofenac Sodium (VOLTAREN PO) Take by mouth.    . Fexofenadine HCl (ALLEGRA PO) Take 1 tablet by mouth daily as needed. Patient used this medication for her allergies.    . fish oil-omega-3 fatty acids 1000 MG capsule Take 2 g by mouth daily.    . Fluticasone Propionate (FLONASE NA) Place 1 puff into the nose daily as needed.    . Garcinia Cambogia-Chromium 500-200 MG-MCG TABS Take by mouth. Weight loss    . GREEN COFFEE BEAN PO Take 1 tablet by mouth daily.    Marland Kitchen HYDROcodone-acetaminophen (NORCO) 5-325 MG per tablet Take 1-2 tablets by mouth every 4 (four) hours as needed. 10 tablet 0  . hydrocortisone cream 1 % Apply topically 2 (two) times daily. Rash    . ibuprofen (ADVIL,MOTRIN) 200 MG tablet Take 600 mg by mouth every 6 (six) hours as needed. Patient is using this medication for pain in her hand. Pian in back    . ibuprofen (ADVIL,MOTRIN) 600 MG tablet Take 1 tablet (600 mg total) by mouth every 6 (six) hours as needed for pain. 30 tablet 0  . mometasone (NASONEX) 50 MCG/ACT nasal spray Place 2 sprays into the nose daily. 17 g 12  . Multiple Vitamin (MULTIVITAMIN) tablet Take 1 tablet by mouth daily.    Marland Kitchen oxymetazoline (AFRIN NASAL SPRAY) 0.05 % nasal spray Place 2 sprays into the nose 2 (two) times daily. 30 mL 0  . phenylephrine-shark liver  oil-mineral oil-petrolatum (PREPARATION H) 0.25-3-14-71.9 % rectal ointment Place rectally 2 (two) times daily as needed.    Marland Kitchen RASPBERRY PO Take 1 tablet by mouth 2 (two) times daily. Patient is using Raspberry Ketones as a dietary supplement.    Marland Kitchen witch hazel-glycerin (TUCKS) pad Place rectally as needed.     No current facility-administered medications for this visit.    REVIEW OF SYSTEMS:   Constitutional: Denies fevers, chills or abnormal night sweats Eyes: Denies blurriness of vision, double vision or watery eyes Ears, nose, mouth, throat, and face: Denies mucositis or sore throat  Respiratory: Denies cough, dyspnea or wheezes Cardiovascular: Denies palpitation, chest discomfort or lower extremity swelling Gastrointestinal:  Denies nausea, heartburn or change in bowel habits Skin: Denies abnormal skin rashes Lymphatics: Denies new lymphadenopathy or easy bruising Neurological:Denies numbness, tingling or new weaknesses Behavioral/Psych: Mood is stable, no new changes  Breast: Palpable abnormality in the left breast All other systems were reviewed with the patient and are negative.  PHYSICAL EXAMINATION: ECOG PERFORMANCE STATUS: 0 - Asymptomatic  There were no vitals filed for this visit. There were no vitals filed for this visit.  GENERAL:alert, no distress and comfortable SKIN: skin color, texture, turgor are normal, no rashes or significant lesions EYES: normal, conjunctiva are pink and non-injected, sclera clear OROPHARYNX:no exudate, no erythema and lips, buccal mucosa, and tongue normal  NECK: supple, thyroid normal size, non-tender, without nodularity LYMPH:  no palpable lymphadenopathy in the cervical, axillary or inguinal LUNGS: clear to auscultation and percussion with normal breathing effort HEART: regular rate & rhythm and no murmurs and no lower extremity edema ABDOMEN:abdomen soft, non-tender and normal bowel sounds Musculoskeletal:no cyanosis of digits and no  clubbing  PSYCH: alert & oriented x 3 with fluent speech NEURO: no focal motor/sensory deficits BREAST: Dense breast tissue to palpation with palpable nodularity in the left breast. No palpable axillary or supraclavicular lymphadenopathy (exam performed in the presence of a chaperone)   LABORATORY DATA:  I have reviewed the data as listed Lab Results  Component Value Date   WBC 6.8 08/30/2012   HGB 12.9 08/30/2012   HCT 37.8 08/30/2012   MCV 89.6 08/30/2012   PLT 255 08/30/2012   Lab Results  Component Value Date   NA 137 08/30/2012   K 3.8 08/30/2012   CL 103 08/30/2012   CO2 28 08/30/2012    RADIOGRAPHIC STUDIES: I have personally reviewed the radiological reports and agreed with the findings in the report. Results are summarized as above  ASSESSMENT AND PLAN:  Breast cancer of upper-outer quadrant of left female breast ([place]) Left breast DCIS ER 0%, PR 0%, high-grade, Comedo-necrosis with calcifications based on 2 biopsies of the left breast anterior and posterior. Tis N0 stage 0 clinical stage.  Pathology review: I discussed with the patient the difference between DCIS and invasive breast cancer. It is considered a precancerous lesion. DCIS is classified as a 0. It is generally detected through mammograms as calcifications. We discussed the significance of grades and its impact on prognosis. We also discussed the importance of ER and PR receptors and their implications to adjuvant treatment options. Prognosis of DCIS dependence on grade, comedo necrosis. It is anticipated that if not treated, 20-30% of DCIS can develop into invasive breast cancer.  Recommendation: 1. Breast MRI 2. Evaluate if the lymph nodes in the left axilla need to be biopsied + biopsying the right breast 3. Genetic counseling 4. Patient seemed to prefer bilateral mastectomies with reconstruction. She will need sentinel lymph node biopsy on the left breast. If the right breast biopsy is benign, she may not  need sentinel lymph node biopsy on the right side.  Following surgery there is no role of adjuvant antiestrogen therapy because she would be ER/PR negative and also because she would've had bilateral mastectomies.  Breast reconstruction: Immediate versus delayed Patient is worried that there may be an invasive breast cancer and that she could need chemotherapy. If there was an invasive breast cancer, she will need systemic chemotherapy for ER PR negative cancer. Her wish is to undergo immediate reconstruction if  he can improve ahead of time that there was no invasive cancer in the left axilla. She wants to undergo left axillary lymph node biopsy and if that is negative, she will up for immediate reconstruction.  If the lymph node is positive for cancer, then they know that there must be invasive breast cancer somewhere in the breast and they will wait on reconstruction until after chemotherapy is completed.  I would like to see her back after surgery to discuss the final pathology report and see if there are any changes to our recommendations.  All questions were answered. The patient knows to call the clinic with any problems, questions or concerns.    Rulon Eisenmenger, MD 12:43 PM    ADDENDUM: Typo in Past Medical History: It should say Breast cancer and not Brain cancer.

## 2015-03-02 NOTE — Assessment & Plan Note (Signed)
Left breast DCIS ER 0%, PR 0%, high-grade, Comedo-necrosis with calcifications based on 2 biopsies of the left breast anterior and posterior. Tis N0 stage 0 clinical stage.  Pathology review: I discussed with the patient the difference between DCIS and invasive breast cancer. It is considered a precancerous lesion. DCIS is classified as a 0. It is generally detected through mammograms as calcifications. We discussed the significance of grades and its impact on prognosis. We also discussed the importance of ER and PR receptors and their implications to adjuvant treatment options. Prognosis of DCIS dependence on grade, comedo necrosis. It is anticipated that if not treated, 20-30% of DCIS can develop into invasive breast cancer.  Recommendation: 1. Breast MRI 2. Evaluate if the lymph nodes in the left axilla need to be biopsied 3. Genetic counseling 4. Patient seemed to prefer bilateral mastectomies with reconstruction.  Following surgery there is no role of adjuvant antiestrogen therapy because she would be ER/PR negative and also because she would've had bilateral mastectomies. I would like to see her back after surgery to discuss the final pathology report and see if there are any changes to our recommendations.

## 2015-03-02 NOTE — Progress Notes (Signed)
Note created by Dr. Lindi Adie during office visit, sent to scan.

## 2015-03-03 ENCOUNTER — Other Ambulatory Visit: Payer: Self-pay | Admitting: Surgery

## 2015-03-03 DIAGNOSIS — D0512 Intraductal carcinoma in situ of left breast: Secondary | ICD-10-CM

## 2015-03-04 ENCOUNTER — Ambulatory Visit
Admission: RE | Admit: 2015-03-04 | Discharge: 2015-03-04 | Disposition: A | Discharge status: Home / Self Care | Payer: BLUE CROSS/BLUE SHIELD | Source: Ambulatory Visit | Attending: Surgery | Admitting: Surgery

## 2015-03-04 ENCOUNTER — Encounter: Payer: Self-pay | Admitting: Hematology and Oncology

## 2015-03-04 ENCOUNTER — Other Ambulatory Visit: Payer: Self-pay | Admitting: Medical Oncology

## 2015-03-04 ENCOUNTER — Ambulatory Visit
Admission: RE | Admit: 2015-03-04 | Discharge: 2015-03-04 | Disposition: A | Discharge status: Home / Self Care | Payer: BLUE CROSS/BLUE SHIELD | Source: Ambulatory Visit | Attending: Medical Oncology | Admitting: Medical Oncology

## 2015-03-04 DIAGNOSIS — N6312 Unspecified lump in the right breast, upper inner quadrant: Secondary | ICD-10-CM

## 2015-03-04 DIAGNOSIS — N6452 Nipple discharge: Secondary | ICD-10-CM

## 2015-03-04 DIAGNOSIS — N6320 Unspecified lump in the left breast, unspecified quadrant: Secondary | ICD-10-CM

## 2015-03-04 DIAGNOSIS — D0512 Intraductal carcinoma in situ of left breast: Secondary | ICD-10-CM

## 2015-03-04 DIAGNOSIS — N6310 Unspecified lump in the right breast, unspecified quadrant: Secondary | ICD-10-CM

## 2015-03-04 DIAGNOSIS — N6314 Unspecified lump in the right breast, lower inner quadrant: Secondary | ICD-10-CM

## 2015-03-04 DIAGNOSIS — N6313 Unspecified lump in the right breast, lower outer quadrant: Secondary | ICD-10-CM

## 2015-03-04 DIAGNOSIS — N6315 Unspecified lump in the right breast, overlapping quadrants: Secondary | ICD-10-CM

## 2015-03-04 DIAGNOSIS — C50912 Malignant neoplasm of unspecified site of left female breast: Secondary | ICD-10-CM | POA: Insufficient documentation

## 2015-03-04 MED ORDER — GADOBENATE DIMEGLUMINE 529 MG/ML IV SOLN: 15.0000 mL | Freq: Once | INTRAVENOUS | Status: DC | PRN

## 2015-03-04 NOTE — Progress Notes (Signed)
Location of Breast Cancer:  Histology per Pathology Report:  02/18/15 Diagnosis 1. Breast, left, needle core biopsy, anterior, 12:00 o'clock - DUCTAL CARCINOMA IN SITU WITH NECROSIS AND CALCIFICATIONS, SEE COMMENT. 2. Breast, left, needle core biopsy, posterior, 12:00 o'clock - DUCTAL CARCINOMA IN SITU WITH NECROSIS AND CALCIFICATIONS Receptor Status: ER(-), PR (-), Her2-neu   [name] initially presented with bilateral breast pain. Left breast pain was worse over the past 3 months. She also noticed a lump in the left breast. There was also some brownish drainage. She underwent a mammogram on 02/18/2015 that showed extensive calcification throughout the left breast measuring 12.5 cm. On the right breast there were some possible masses in the subareolar region. Ultrasound of the breasts was performed on both sides. Left breast showed an irregular mass 4.5 cm in size. There were also prominent left axillary lymph nodes with a 4 mm thickened cortex. Right breast ultrasound was otherwise negative but had some cysts.  Past/Anticipated interventions by surgeon, if DDU:KGURK core Biopsy of the Left Breast  Past/Anticipated interventions by medical oncology, if any: Chemotherapy ???  Lymphedema issues, if any  Pain issues, if any:  SAFETY ISSUES:  Prior radiation?   Pacemaker/ICD?   Possible current pregnancy?  Is the patient on methotrexate? No  Current Complaints / other details:    She menarched at early age of 28  She had 2 pregnancy, her first child was born at age  She has not received birth control pills but used Depo-Provera.  She was never exposed to fertility medications or hormone replacement therapy.  She has no family history of Breast/GYN/GI cancer     Hershel Corkery, Crista Curb, RN 03/04/2015,4:50 PM

## 2015-03-05 ENCOUNTER — Ambulatory Visit: Payer: BLUE CROSS/BLUE SHIELD

## 2015-03-05 ENCOUNTER — Ambulatory Visit
Admission: RE | Admit: 2015-03-05 | Discharge: 2015-03-05 | Disposition: A | Discharge status: Home / Self Care | Payer: BLUE CROSS/BLUE SHIELD | Source: Ambulatory Visit | Attending: Radiation Oncology | Admitting: Radiation Oncology

## 2015-03-05 DIAGNOSIS — Z79899 Other long term (current) drug therapy: Secondary | ICD-10-CM | POA: Insufficient documentation

## 2015-03-05 DIAGNOSIS — Z87891 Personal history of nicotine dependence: Secondary | ICD-10-CM | POA: Insufficient documentation

## 2015-03-05 DIAGNOSIS — C50412 Malignant neoplasm of upper-outer quadrant of left female breast: Secondary | ICD-10-CM | POA: Insufficient documentation

## 2015-03-06 ENCOUNTER — Other Ambulatory Visit: Payer: BLUE CROSS/BLUE SHIELD

## 2015-03-09 ENCOUNTER — Telehealth: Payer: Self-pay | Admitting: *Deleted

## 2015-03-09 NOTE — Telephone Encounter (Signed)
Left message for patient to call to reschedule missed appointment with Dr. Valere Dross for 03/05/15.

## 2015-03-10 NOTE — Progress Notes (Signed)
Location of Breast Cancer: Left Breast  Histology per Pathology Report: DCIS  02/18/15 Diagnosis 1. Breast, left, needle core biopsy, anterior, 12:00 o'clock - DUCTAL CARCINOMA IN SITU WITH NECROSIS AND CALCIFICATIONS, SEE COMMENT. 2. Breast, left, needle core biopsy, posterior, 12:00 o'clock - DUCTAL CARCINOMA IN SITU WITH NECROSIS AND CALCIFICATIONS  Receptor Status: ER(0%), PR (0%), Her2-neu ()  Ms. Shabnam Ladd presented with bilateral breast pain. Left breast pain was worse over the past 3 months. She also noticed a lump in the left breast. There was also some brownish drainage. She underwent a mammogram on 02/18/2015 that showed extensive calcification throughout the left breast measuring 12.5 cm. On the right breast there were some possible masses in the subareolar region. Ultrasound of the breasts was performed on both sides. Left breast showed an irregular mass 4.5 cm in size. There were also prominent left axillary lymph nodes with a 4 mm thickened cortex. Right breast ultrasound was otherwise negative but had some cysts  Past/Anticipated interventions by surgeon, if any: Needle Core Biopsy   Past/Anticipated interventions by medical oncology, if any: none  Lymphedema issues, if any:  no   Pain issues, if any:  no   SAFETY ISSUES:  Prior radiation? no  Pacemaker/ICD? no  Possible current pregnancy?no  Is the patient on methotrexate? NO  Current Complaints / other details:    She menarched at early age of 76  She had 2 pregnancy, her first child was born at age  She has not received birth control pills but used Depo-Provera.  She was never exposed to fertility medications or hormone replacement therapy.  She has no family history of Breast/GYN/GI cancer  Biopsy sites well healed. Edema of left axilla noted. Patient explains edema of left axilla has been present for years and understands the edema from fat cells. Reports her breast remains sore. Reports using  condoms as her only means of birth control. Reports an aunt and grandmother both smoker who have lung ca. Also, patient reports an adolescent cousin with cancer of the brain. Patient plans to have a bilateral mastectomy.  Cheston, Crista Curb, RN 03/10/2015,4:19 PM

## 2015-03-11 ENCOUNTER — Ambulatory Visit
Admission: RE | Admit: 2015-03-11 | Discharge: 2015-03-11 | Disposition: A | Discharge status: Home / Self Care | Payer: BLUE CROSS/BLUE SHIELD | Source: Ambulatory Visit | Attending: Radiation Oncology | Admitting: Radiation Oncology

## 2015-03-11 ENCOUNTER — Encounter: Payer: Self-pay | Admitting: Radiation Oncology

## 2015-03-11 VITALS — BP 137/84 | HR 87 | Temp 98.0°F | Resp 16 | Ht 61.0 in | Wt 163.2 lb

## 2015-03-11 DIAGNOSIS — Z87891 Personal history of nicotine dependence: Secondary | ICD-10-CM | POA: Diagnosis not present

## 2015-03-11 DIAGNOSIS — C50412 Malignant neoplasm of upper-outer quadrant of left female breast: Secondary | ICD-10-CM

## 2015-03-11 DIAGNOSIS — C50812 Malignant neoplasm of overlapping sites of left female breast: Secondary | ICD-10-CM

## 2015-03-11 DIAGNOSIS — Z79899 Other long term (current) drug therapy: Secondary | ICD-10-CM | POA: Diagnosis not present

## 2015-03-11 DIAGNOSIS — D0592 Unspecified type of carcinoma in situ of left breast: Secondary | ICD-10-CM

## 2015-03-11 NOTE — Progress Notes (Signed)
See progress note under physician encounter. 

## 2015-03-11 NOTE — Addendum Note (Signed)
Encounter addended by: Benn Moulder, RN on: 03/11/2015  5:44 PM<BR>     Documentation filed: Visit Diagnoses

## 2015-03-11 NOTE — Progress Notes (Signed)
[facility] NEW PATIENT EVALUATION  Name: [name] MRN: [id number]  Date:   03/11/2015           DOB: 08-10-1975  Status: outpatient   CC: Adline Mango, MD  Donnie Mesa, MD , Dr. 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 female who is seen today through the courtesy of Dr. Georgette Dover for evaluation of her DCIS of the left breast.  She recently presented with a three-month history of fluctuating left and right breast discomfort and masses.  She was felt to have a palpable left breast masses along with a brownish left nipple discharge.  She also had "lumpiness" within her right breast.  Her first ever mammogram on 02/17/2015 showed extensive calcifications, predominantly involving the upper left breast, but extending slightly below the level of the nipple.  These calcifications extended over an area of 10.0 x 12.5 x 9.5 cm.  Mammography of the right breast showed possible masses in the subareolar right breast and in the upper outer quadrant of the right breast.  Calcifications were also noted and were felt to be benign.  On examination of the left breast, the palpable area of concern was just medial to the nipple but there was also some concern along the superior aspect of left breast, both medially and laterally felt to be abnormally firm.  Ultrasound showed a large irregular hypoechoic mass measuring 4.5 cm.  There was a prominent left axillary lymph node with a mildly thickened cortex.  A hypoechoic 1.8 cm mass was seen in the right breast at 8:00 and a cyst was seen at 2:00.  Stereotactic biopsies of the superior left breast were diagnostic for DCIS with necrosis and calcifications with DCIS felt to be high grade.  The DCIS was ER/PR negative.  Breast MR on 02/26/2015 showed non-mass like enhancement greater than 50% of the left  breast measuring 10.5 x 9.5 x 8.1 cm consistent with high-grade DCIS.  There was felt to be  possible left axillary adenopathy suspicious for nodal disease.  There were multiple areas of clumped non-mass like enhancement in the right breast, suspicious for contralateral disease.  MRI guided biopsies of the right breast on 03/04/2015 were benign.  Biopsy of the left axillary lymph node was also benign.  She was seen in consultation by Tsuei who also had her see Dr. Crissie Reese of plastic surgery, and the plan is for bilateral mastectomies along with a left sentinel lymph node biopsy.  She is interested in immediate reconstruction if this is oncologically sound.  PREVIOUS RADIATION THERAPY: No   PAST MEDICAL HISTORY:  has a past medical history of Irregular periods/menstrual cycles; Dysmenorrhea; Paresthesia of right leg; Breast cyst; HPV (human papilloma virus) infection; Pyelonephritis (7/06, 1988); Back pain; and Breast cancer, left breast ([place]) (2016).     PAST SURGICAL HISTORY:  Past Surgical History  Procedure Laterality Date  . Dental surgery    . Cryotherapy       FAMILY HISTORY: family history includes Brain cancer (age of onset: 74) in her cousin; COPD in her father; Cleft lip in her sister; Depression in her father; Diabetes in her maternal grandfather; Emphysema in her father; Heart Problems in her maternal uncle; Heart murmur in her mother; Hypertension in her father; Lung cancer in her maternal grandmother, other, and paternal grandfather;  Melanoma in her other.  Her father is alive and well at 9, and her mother is alive and well at 34.  No family history of breast or ovarian cancer.   SOCIAL HISTORY:  reports that she quit smoking about 12 years ago. She has never used smokeless tobacco. She reports that she does not drink alcohol or use illicit drugs.  Married, 2 children ages 46 and 18.  She is currently studying criminal justice online through [employer].   ALLERGIES: Bee pollen;  Erythromycin; Nitrofurantoin monohyd macro; Pyridium; and Zithromax   MEDICATIONS:  Current Outpatient Prescriptions  Medication Sig Dispense Refill  . ALPRAZolam (XANAX) 0.5 MG tablet Take 0.5 mg by mouth at bedtime as needed for anxiety.    Marland Kitchen HYDROcodone-acetaminophen (NORCO) 5-325 MG per tablet Take 1-2 tablets by mouth every 4 (four) hours as needed. 10 tablet 0  . Multiple Vitamins-Minerals (SUPER ANTIOXIDANTS PROTECTOR PO) Take by mouth.     No current facility-administered medications for this encounter.     REVIEW OF SYSTEMS:  Pertinent items are noted in HPI.    PHYSICAL EXAM: Alert and oriented 39 year old white female appearing her stated age. Wt Readings from Last 3 Encounters:  03/11/15 163 lb 3.2 oz (74.027 kg)  03/02/15 164 lb 6.4 oz (74.571 kg)  06/10/14 150 lb (68.04 kg)   Temp Readings from Last 3 Encounters:  03/11/15 98 F (36.7 C) Oral  03/02/15 98.2 F (36.8 C) Oral  06/10/14 98 F (36.7 C) Oral   BP Readings from Last 3 Encounters:  03/11/15 137/84  03/02/15 129/92  06/10/14 132/72   Pulse Readings from Last 3 Encounters:  03/11/15 87  03/02/15 83  06/10/14 79   Nodes: There is no palpable cervical, supraclavicular, or axillary lymphadenopathy.  Breasts: She has multiple ill-defined masses along both breasts, left greater than right particular along the central and upper regions.  There are biopsy wounds along the left breast at 12:00 along the right breast along the upper-outer quadrant.  Extremities: Without edema.    LABORATORY DATA:  Lab Results  Component Value Date   WBC 6.8 08/30/2012   HGB 12.9 08/30/2012   HCT 37.8 08/30/2012   MCV 89.6 08/30/2012   PLT 255 08/30/2012   Lab Results  Component Value Date   NA 137 08/30/2012   K 3.8 08/30/2012   CL 103 08/30/2012   CO2 28 08/30/2012   No results found for: ALT, AST, GGT, ALKPHOS, BILITOT    IMPRESSION: Biopsy-proven high-grade DCIS of the left breast.  I agree with plans  for bilateral mastectomies and left sentinel lymph node biopsy.  I explained her that the risk for having occult invasive disease with potential for spread to lymph nodes is related to size of DCIS, and also nuclear grade.  She certainly is at risk for occult invasive disease to the point that she should have a sentinel lymph node biopsy.  Overall, her risk for having occult invasive disease with involvement of a sentinel lymph node was still be low , and I feel that immediate reconstruction is certainly reasonable.  Furthermore, if she is found to have occult invasive disease with spread to a sentinel lymph node, which would be an indication for post mastectomy radiation, she would probably need adjuvant chemotherapy during which time the tissue expander could be properly expanded.  Then one could decide whether not to place her permanent implant before after post mastectomy radiation therapy.  At this point in time she is interested in  immediate reconstruction.   PLAN: As above.  I spent 45  minutes face to face with the patient and more than 50% of that time was spent in counseling and/or coordination of care.

## 2015-03-13 ENCOUNTER — Ambulatory Visit: Payer: Self-pay | Admitting: Genetic Counselor

## 2015-03-13 ENCOUNTER — Encounter: Payer: Self-pay | Admitting: Genetic Counselor

## 2015-03-13 ENCOUNTER — Telehealth: Payer: Self-pay | Admitting: Genetic Counselor

## 2015-03-13 DIAGNOSIS — C50412 Malignant neoplasm of upper-outer quadrant of left female breast: Secondary | ICD-10-CM

## 2015-03-13 DIAGNOSIS — Z1379 Encounter for other screening for genetic and chromosomal anomalies: Secondary | ICD-10-CM | POA: Insufficient documentation

## 2015-03-13 DIAGNOSIS — C50812 Malignant neoplasm of overlapping sites of left female breast: Secondary | ICD-10-CM

## 2015-03-13 NOTE — Telephone Encounter (Signed)
Revealed that she had negative genetic testing.

## 2015-03-13 NOTE — Progress Notes (Signed)
HPI: [name] was previously seen in the [facility] due to a personal history of cancer and concerns regarding a hereditary predisposition to cancer. Please refer to our prior cancer genetics clinic note for more information regarding [name]'s medical, social and family histories, and our assessment and recommendations, at the time. [name] recent genetic test results were disclosed to her, as were recommendations warranted by these results. These results and recommendations are discussed in more detail below.  FAMILY HISTORY:  We obtained a detailed, 4-generation family history.  Significant diagnoses are listed below: Family History  Problem Relation Age of Onset  . Hypertension Father   . COPD Father   . Depression Father   . Emphysema Father   . Heart murmur Mother   . Cleft lip Sister   . Lung cancer Maternal Grandmother     dx in her 42s, smoker  . Diabetes Maternal Grandfather   . Heart Problems Maternal Uncle   . Lung cancer Paternal Grandfather   . Lung cancer Other     MGMs sister  . Melanoma Other     MGMs sister  . Brain cancer Cousin 11    maternal cousin's daughter    The patient has two boys, and one sister none of whom have cancer. Both parents are alive, her mother is 5 and a smoker and her father is 41 and a smoker. Her mother has one brother who has a granddaughter who is 45 with a brain tumor. Her maternal grandparents are both deceased. Her grandfather from comlications of diabetes, and her grandmother from lung cancer. Her grandmother had one sister who died from lung cancer and was a smoker, and one sister who recently died from [place]. Her father had two brothers who are cancer free. Her paternal grandparents are deceased, her grandfather from lung cancer and her grandmother from a MVA. Patient's maternal ancestors are of Caucasian descent, and paternal ancestors are of Caucasian and [place] descent. There is no  reported Ashkenazi Jewish ancestry. There is no known consanguinity.  GENETIC TEST RESULTS: At the time of [name]'s visit, we recommended she pursue genetic testing of the Breast/Ovarian cancer gene panel. The Breast/Ovarian gene panel offered by [facility] includes sequencing and rearrangement analysis for the following 20 genes:  ATM, BARD1, BRCA1, BRCA2, BRIP1, CDH1, CHEK2, EPCAM, FANCC, MLH1, MSH2, MSH6, NBN, PALB2, PMS2, PTEN, RAD51C, RAD51D, TP53, and XRCC2.   The report date is March 11, 2015.  Genetic testing was normal, and did not reveal a deleterious mutation in these genes. The test report has been scanned into EPIC and is located under the Molecular Pathology section of the Results Review tab.   We discussed with [name] that since the current genetic testing is not perfect, it is possible there may be a gene mutation in one of these genes that current testing cannot detect, but that chance is small. We also discussed, that it is possible that another gene that has not yet been discovered, or that we have not yet tested, is responsible for the cancer diagnoses in the family, and it is, therefore, important to remain in touch with cancer genetics in the future so that we can continue to offer [name] the most up to date genetic testing.   CANCER SCREENING RECOMMENDATIONS: This result is reassuring and indicates that [name] likely does not have an increased risk for a future cancer due to a mutation in one of these genes. This normal  test also suggests that [name]'s cancer was most likely not due to an inherited predisposition associated with one of these genes.  Most cancers happen by chance and this negative test suggests that her cancer falls into this category.  We, therefore, recommended she continue to follow the cancer management and screening guidelines provided by her oncology and primary healthcare provider.   RECOMMENDATIONS FOR FAMILY MEMBERS: Women in this family  might be at some increased risk of developing cancer, over the general population risk, simply due to the family history of cancer. We recommended women in this family have a yearly mammogram beginning at age 40, or 46 years younger than the earliest onset of cancer, an an annual clinical breast exam, and perform monthly breast self-exams. Women in this family should also have a gynecological exam as recommended by their primary provider. All family members should have a colonoscopy by age 32.  FOLLOW-UP: Lastly, we discussed with [name] that cancer genetics is a rapidly advancing field and it is possible that new genetic tests will be appropriate for her and/or her family members in the future. We encouraged her to remain in contact with cancer genetics on an annual basis so we can update her personal and family histories and let her know of advances in cancer genetics that may benefit this family.   Our contact number was provided. [name] questions were answered to her satisfaction, and she knows she is welcome to call us at anytime with additional questions or concerns.   Roma Kayser, MS, [facility] Certified Genetic Counselor [email or web address]

## 2015-03-16 ENCOUNTER — Ambulatory Visit: Payer: Self-pay | Admitting: Surgery

## 2015-03-16 DIAGNOSIS — C50912 Malignant neoplasm of unspecified site of left female breast: Secondary | ICD-10-CM

## 2015-03-18 ENCOUNTER — Ambulatory Visit: Payer: Self-pay | Admitting: Surgery

## 2015-03-18 NOTE — H&P (Signed)
The patient is a 39 year old female who presents with breast cancer.   PCP - Dr. 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 She presents with several years of bilateral breast pain. The tenderness in her left breast seems to be worse over the last 2-3 months. In the last 2 or 3 months she noticed a lump that was palpable just to the left of her left nipple. One week ago she noticed some brownish drainage that was expressed from her nipple on the left. She has not noticed any discharge since that time. She underwent her first mammogram on 02/18/15. The left breast mammogram showed extensive calcifications predominantly involving the upper left breast measuring 10 x 12.5 x 9.5 cm. The breast tissue is quite dense. On the right there are some possible masses in the subareolar right breast and in the upper outer quadrant with some benign appearing calcifications noted. Left breast ultrasound showed an irregular hypoechoic branching mass measuring 4.5 x 2.3 x 1.1 cm. The upper part of the breasts shows diffusely abnormal tissue that is not able to be measured sonographically. The left axilla shows a prominent left axillary lymph node with a 4 mm thickened cortex. The right breast ultrasound showed multiple anechoic cyst and dilated ducts. There is a 1.4 x 0.8 x 0.8 cm complicated cyst at 1:61. The right axillary ultrasound is normal.  MRI was performed.  She underwent biopsy on 9/28 with two core biopsies at 12:00. The biopsies are 8.4 cm apart. 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 ER/ PR negative.  Further biopsies were performed of a suspicious area on the right breast as well as an enlarged left axillary lymph node.  The lymph node biopsy was  negative.    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 - FIBROCYSTIC CHANGES WITH USUAL DUCTAL HYPERPLASIA. - THERE IS NO EVIDENCE OF MALIGNANCY.  Genetic testing was negative.  She has consulted with Drs. Lorane Gell, and [place].    Menarche - 70 First pregnancy - 17 Breastfeed - yes Hormones - Depo shots Family history - negative LMP - last week   CLINICAL DATA: 39 year old female presenting for a palpable abnormality in the left breast adjacent to the nipple, as well as brownish left nipple discharge. She describes a palpable lump in her left axilla as well. She also describes" lumpiness" of the right breast, without definite focal abnormality.  EXAM: DIGITAL DIAGNOSTIC BILATERAL MAMMOGRAM WITH 3D TOMOSYNTHESIS AND CAD  BILATERAL BREAST ULTRASOUND  COMPARISON: Prior left breast ultrasounds. No prior mammograms available for comparison.  ACR Breast Density Category c: The breast tissue is heterogeneously dense, which may obscure small masses.  FINDINGS: Mammogram images the left breast demonstrate extensive calcifications predominantly involving the upper left breast but extending slightly below the level of the nipple on the MLO view. In axial dimension the calcifications span approximately 10.0 x 12.5 cm, and span 9.5 cm in the craniocaudal dimension. A palpable marker has been placed just medial to the nipple denoting an area of palpable concern. There is a mass deep to this palpable marker, however due to the density of the breast, margins are difficult to define making accurate measurement difficult.  Mammogram images of the right breast demonstrate possible  masses in the subareolar right breast and in the upper-outer quadrant of the right breast. Calcifications are noted, which layer on the ML view, compatible with benign milk of calcium. There are no suspicious calcifications or areas of  distortion identified in the right breast.  Mammographic images were processed with CAD.  Physical exam of the left breast at the palpable area of concern demonstrates a firm lumpy mass just medial to the nipple. Additionally, the superior aspect of the left breast, both medially and laterally, is abnormally firm, likely corresponding with the broad region of calcifications seen on mammogram. On physical exam of this firm area spans at least 10 cm. Physical exam of the left axilla demonstrates soft tissue without discrete mass at the site of the patient's palpable concern, however palpation does elicit tenderness.  Physical exam of the right breast demonstrates extensive lumpiness in the periareolar and lateral right breast.  Ultrasound targeted to the palpable area of concern in the left breast at 9 o'clock, 1 cm from the nipple demonstrates a irregular hypoechoic circumscribed branching mass measuring 4.5 x 2.3 x 1.1 cm. This may represent an intraductal mass versus a dilated duct filled with debris or blood. No definite blood flow seen within the mass. Ultrasound of the superior left breast imaged from the 9 o'clock to the 3 o'clock position and demonstrates diffusely abnormal tissue corresponding to the palpable abnormality on physical exam and to the location of the calcifications on mammography. Due to the irregular an insinuating appearance of the mass, this was not able to be measured sonographically.  Ultrasound of the left axilla at the area of palpable concern demonstrates a prominent left axillary lymph node with cortex measuring up to 4 mm. The patient describes tenderness at this site. Multiple other small normal-appearing lymph nodes are identified.  Ultrasound of the subareolar right breast demonstrates multiple anechoic cysts and significantly dilated ducts. These extend into the upper-outer quadrant of the right breast. At the 8 o'clock position, there is a  hypoechoic circumscribed mass measuring 1.8 x 0.7 x 1.4 cm. This may be within a duct. No blood flow seen within this mass. At 2 o'clock, 1 cm from the nipple there is a bilobed mass with both anechoic and hypoechoic areas internally, with an appearance suggesting a complicated cyst. This measures 1.4 x 0.8 x 0.8 cm.  Ultrasound of the right axilla demonstrates multiple normal-appearing lymph nodes with cortices measuring up to 3 mm.  IMPRESSION: 1. Extensive diffuse calcifications throughout the left breast, but predominantly in the upper outer and upper inner quadrants spanning up to 12.5 cm in the craniocaudal dimension. This is highly suspicious for at least DCIS.  2. The palpable area of concern in the left breast just medial to the nipple corresponds with a large irregular hypoechoic mass measuring at least 4.5 cm. No blood flow seen within the mass, and this could represent debris filling a duct versus an intraductal mass.  3. A prominent left axillary lymph node with mildly thickened cortex is identified at the tender palpable site in the left axilla.  4. Hypoechoic 1.8 cm circumscribed mass in the right breast at 8 o'clock may represent debris within the duct versus a intraductal mass.  5. Probable complicated cyst in the right breast at 2 o'clock, 1 cm from the nipple.  RECOMMENDATION: 1. Stereotactic biopsy is recommended for two distant sites of calcifications in the left breast. This has been scheduled for 02/18/2015 and 8 a.m.  2. Biopsy of the tender  left axillary lymph node with mildly thickened cortex with ultrasound guidance is recommended.  3. MRI of the bilateral breasts is recommended given the patient's age, extent of presumed disease in the left breast, questioned abnormalities in the right breast and density of the breast tissue. The results of the MRI may guide recommended biopsies of the right breast to the most suspicious findings. If no  additional sites of disease are seen in the right breast on the MRI, then ultrasound guided aspiration vs biopsy of the complicated cyst in the right breast at 2 o'clock and the possible mass or debris filled duct in the right breast at 8 o'clock.  I have discussed the findings and recommendations with the patient. Results were also provided in writing at the conclusion of the visit. If applicable, a reminder letter will be sent to the patient regarding the next appointment.  BI-RADS CATEGORY 5: Highly suggestive of malignancy.   Electronically Signed By: Ammie Ferrier M.D. On: 02/18/2015 12:32   CLINICAL DATA: 39 year old female presenting for stereotactic biopsy of the left breast calcifications.  EXAM: LEFT BREAST STEREOTACTIC CORE NEEDLE BIOPSY  COMPARISON: Previous exams.  FINDINGS: The patient and I discussed the procedure of stereotactic-guided biopsy including benefits and alternatives. We discussed the high likelihood of a successful procedure. We discussed the risks of the procedure including infection, bleeding, tissue injury, clip migration, and inadequate sampling. Informed written consent was given. The usual time out protocol was performed immediately prior to the procedure.  Using sterile technique and 1% lidocaine as local anesthetic, under stereotactic guidance, a 9 gauge vacuum assisted device was used to perform core needle biopsy of calcifications in the superior left breast at 12 o'clock in the anterior depth using a superior approach. Specimen radiograph was performed showing calcifications in multiple specimens. Specimens with calcifications are identified for pathology.  At the conclusion of the procedure, a X shapedTissue marker clip was deployed into the biopsy cavity.  Next, the procedure was repeated the same technique as above to target the calcifications in the superior left breast at 12 o'clock, posterior depth using a superior  approach. Specimen radiograph was performed showing calcifications in multiple specimens. Specimens with calcifications were identified for pathology.  At the conclusion of the procedure, a coil shaped tissue marker clip was deployed into the biopsy cavity. Follow-up 2-view mammogram was performed and dictated separately.  IMPRESSION: Stereotactic-guided biopsy of the anterior and posterior aspect of calcifications in the superior left breast. No apparent complications.  Electronically Signed: By: Ammie Ferrier M.D. 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 This was found to be concordant by Dr. Ammie Ferrier. Pathology was discussed with the patient by telephone. She reported marked tenderness and mild bruising at the site. Post biopsy instructions and care were reviewed and her questions were answered. The patient requests surgical consultation in [place]. She has been scheduled with Dr. Donnie Mesa at [facility] on February 23, 2015. A bilateral breast MRI is recommended to determine extent of disease and evaluation of the contralateral breast for guidance on additional sites of biopsy. If no sites are identified in the right breast are identified by MRI, then ultrasound guided aspiration/biopsy is recommended for the two abnormalities identified on recent ultrasound. Also note that left axillary lymph node biopsy has been previously recommended and will need to be scheduled after the MRI is completed. Pathology results reported by  Susa Raring RN, BSN on February 20, 2015. Electronically Signed By: Ammie Ferrier M.D. On: 02/20/2015 15:33   CLINICAL DATA: Post biopsy mammogram for clip placement.  EXAM: DIAGNOSTIC LEFT MAMMOGRAM POST STEREOTACTIC BIOPSY  COMPARISON: Previous  exam(s).  FINDINGS: Mammographic images were obtained following stereotactic guided biopsy of the anterior and posterior margins of the calcifications in the superior left breast. An X shaped biopsy marking clip is seen anteriorly and a coil shaped biopsy marking clip is seen posteriorly denoting the intended sites of biopsy. The clips in the MLO view lie 8.4 cm apart at the approximate 12 o'clock position.  IMPRESSION: Appropriate positioning of the 2 biopsy marking clips in the superior left breast at 12 o'clock. On this post biopsy mammogram, of view clips by 8.4 cm apart.  Final Assessment: Post Procedure Mammograms for Marker Placement   Electronically Signed By: Ammie Ferrier M.D. On: 02/18/2015 11:16   CLINICAL DATA: Patient is a 39 year old female with newly diagnosed high-grade ductal carcinoma in situ of the left breast.  LABS: No labs today  EXAM: BILATERAL BREAST MRI WITH AND WITHOUT CONTRAST  TECHNIQUE: Multiplanar, multisequence MR images of both breasts were obtained prior to and following the intravenous administration of 15 ml of MultiHance.  THREE-DIMENSIONAL MR IMAGE RENDERING ON INDEPENDENT WORKSTATION:  Three-dimensional MR images were rendered by post-processing of the original MR data on an independent workstation. The three-dimensional MR images were interpreted, and findings are reported in the following complete MRI report for this study. Three dimensional images were evaluated at the independent DynaCad workstation  COMPARISON: Previous exam(s).  FINDINGS: Breast composition: Extreme fibroglandular tissue.  Background parenchymal enhancement: The multi parenchyma multi sequence bilateral breast MR demonstrates marked background parenchymal enhancement, asymmetric to the left breast.  Right breast: There are innumerable T2 bright cysts of varying size throughout the right breast. In addition, there are numerous dilated ducts  identified with nonenhancing T2 bright signal as well as variable T1 signal. No enhancing masses identified in the right breast, however there are multiple areas regions of clumped non mass enhancement.  There is a 5.6 cm AP diameter region of clumped non mass enhancement identified in the upper midline breast. Approximately 3 cm inferior to this, there is a 2.1 x 1.3 cm (AP by transverse) region of clumped non mass enhancement in the lower inner breast. Also, there are less conspicuous areas of regional enhancement involving the lower outer and lateral breast. Focal areas of clumped non mass enhancement are also noted in the subareolar and inferior breast at anterior depth. Some of this regional enhancement may be secondary to background parenchymal enhancement, however the more conspicuous regions in the upper midline and lower inner breast are suspicious.  Left breast: Examination demonstrates extensive clumped and clustered ring non mass enhancement throughout the left breast. This involves the majority of the superior breast extending inferiorly to the central breast involving both the lower outer and lower inner quadrants. In total, the non mass enhancement measures approximately 10.5 x 9.5 x 8.1 cm (AP by transverse by CC dimension). No focal masslike area is noted. Foci of signal dropout are identified in the posterior upper midline left breast and subareolar breast consistent with sites of biopsy. Innumerable sub cm T2 bright cysts are identified its scattered throughout the breast. Diffuse increased T2 signal abnormality is seen throughout the left breast likely secondary to edema. In addition, there is nonenhancing T1 bright/T2 dark material in the subareolar ducts, likely hemorrhage given patient history of nipple discharge.  Lymph nodes: Morphologically abnormal left axillary lymph nodes are identified, previously seen by ultrasound. No evidence of right axillary or  internal mammary adenopathy.  Ancillary findings: None.  IMPRESSION: 1. Non mass enhancement comprises greater than 50% of the left breast, measuring approximately 10.5 x 9.5 x 8.1 cm, consistent with biopsy-proven extensive high-grade ductal carcinoma in situ. 2. Left axillary adenopathy is suspicious for nodal disease. 3. Multiple areas of clumped non mass enhancement in the right breast are suspicious for contralateral disease, as above.  RECOMMENDATION: 1. Recommend MR guided core needle biopsy of at least one of the described regions of non mass enhancement in the right breast. The most conspicuous targets for biopsy are in the upper midline breast and lower inner quadrant, though can be determined on day of biopsy. These findings may also be secondary to background parenchymal enhancement and if not reproducible on day of biopsy, six-month follow-up breast MR may be considered. 2. Ultrasound-guided core needle biopsy of an abnormal left axillary lymph node.  BI-RADS CATEGORY 4: Suspicious.   Electronically Signed By: Andres Shad On: 02/26/2015 14:11  Other Problems Davy Pique Bynum, CMA; 02/23/2015 10:55 AM) Anxiety Disorder Breast Cancer Depression Other disease, cancer, significant illness  Past Surgical History ([place]; 02/23/2015 10:55 AM) Breast Biopsy Left. Oral Surgery  Diagnostic Studies History ([place]; 02/23/2015 10:55 AM) Colonoscopy never Mammogram within last year Pap Smear 1-5 years ago  Allergies Davy Pique Bynum, CMA; 02/23/2015 10:56 AM) No Known Drug Allergies10/07/2014  Medication History (Sonya Bynum, CMA; 02/23/2015 10:58 AM) ALPRAZolam (0.5MG  Tablet, Oral) Active. Gabapentin (300MG  Capsule, Oral) Active. DULoxetine HCl (30MG  Capsule DR Part, Oral) Active. Amphetamine-Dextroamphetamine (20MG  Tablet, Oral) Active. Flexeril (10MG  Tablet, Oral) Active. Wellbutrin SR (150MG  Tablet ER 12HR, Oral)  Active. Adderall XR (20MG  Capsule ER 24HR, Oral) Active. Xanax (0.5MG  Tablet, Oral) Active. Allegra (180MG  Tablet, Oral) Active. Flonase (50MCG/ACT Suspension, Nasal as needed) Active. Advil (100MG  Tablet Chewable, Oral) Active. Multivitamin Adult (Oral) Active. Medications Reconciled  Social History ([place]; 02/23/2015 10:55 AM) Alcohol use Occasional alcohol use. Caffeine use Carbonated beverages, Coffee, Tea. Illicit drug use Remotely quit drug use. Tobacco use Former smoker.  Family History Marjean Donna, [place]; 02/23/2015 10:55 AM) Alcohol Abuse Father. Arthritis Father. Bleeding disorder Sister. Depression Father, Son. Hypertension Father. Migraine Headache Father, Mother, Son. Respiratory Condition Father.  Pregnancy / Birth History Marjean Donna, [place]; 02/23/2015 10:55 AM) Age at menarche 69 years. Contraceptive History Depo-provera. Gravida 2 Maternal age 71-20 Para 2 Regular periods    Review of Systems ([place]; 02/23/2015 10:55 AM) General Present- Weight Gain. Not Present- Appetite Loss, Chills, Fatigue, Fever, Night Sweats and Weight Loss. Skin Not Present- Change in Wart/Mole, Dryness, Hives, Jaundice, New Lesions, Non-Healing Wounds, Rash and Ulcer. HEENT Present- Seasonal Allergies. Not Present- Earache, Hearing Loss, Hoarseness, Nose Bleed, Oral Ulcers, Ringing in the Ears, Sinus Pain, Sore Throat, Visual Disturbances, Wears glasses/contact lenses and Yellow Eyes. Respiratory Not Present- Bloody sputum, Chronic Cough, Difficulty Breathing, Snoring and Wheezing. Breast Present- Breast Mass, Breast Pain, Nipple Discharge and Skin Changes. Cardiovascular Not Present- Chest Pain, Difficulty Breathing Lying Down, Leg Cramps, Palpitations, Rapid Heart Rate, Shortness of Breath and Swelling of Extremities. Gastrointestinal Not Present- Abdominal Pain, Bloating, Bloody Stool, Change in Bowel Habits, Chronic diarrhea, Constipation,  Difficulty Swallowing, Excessive gas, Gets full quickly at meals, Hemorrhoids, Indigestion, Nausea, Rectal Pain and Vomiting. Female Genitourinary Not Present- Frequency, Nocturia, Painful Urination, Pelvic Pain and Urgency. Musculoskeletal Present- Joint Pain. Not Present- Back Pain, Joint Stiffness,  Muscle Pain, Muscle Weakness and Swelling of Extremities. Neurological Not Present- Decreased Memory, Fainting, Headaches, Numbness, Seizures, Tingling, Tremor, Trouble walking and Weakness. Psychiatric Present- Anxiety. Not Present- Bipolar, Change in Sleep Pattern, Depression, Fearful and Frequent crying. Endocrine Not Present- Cold Intolerance, Excessive Hunger, Hair Changes, Heat Intolerance, Hot flashes and New Diabetes. Hematology Not Present- Easy Bruising, Excessive bleeding, Gland problems, HIV and Persistent Infections.  Vitals (Sonya Bynum CMA; 02/23/2015 10:56 AM) 02/23/2015 10:55 AM Weight: 161 lb Height: 61in Body Surface Area: 1.77 m Body Mass Index: 30.42 kg/m  Temp.: 21F(Temporal)  Pulse: 79 (Regular)  BP: 124/86 (Sitting, Left Arm, Standard)     Physical Exam Rodman Key K. Avyanna Spada MD; 02/23/2015 12:25 PM) The physical exam findings are as follows: Note:WDWN in NAD HEENT: EOMI, sclera anicteric Neck: No masses, no thyromegaly Lungs: CTA bilaterally; normal respiratory effort Breasts: Right - upper half of breast is quite firm, no dominant masses; no axillary lymphadenopathy; no nipple discharge Left - upper half of breast is quite firm, no lymphadenopathy; no nipple discharge; at 10:00 behind the areola, there is a 1 cm mass that seems to stand out from the surrounding firm breast tissue CV: Regular rate and rhythm; no murmurs Abd: +bowel sounds, soft, non-tender, no masses Ext: Well-perfused; no edema Skin: Warm, dry; no sign of jaundice    Assessment & Plan Rodman Key K. 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 Note:I spent a considerable amount of time with the patient and her husband as well as her son discussing their situation. Currently the core biopsies show only noninvasive DCIS. However I explained the possibility of sampling error. We discussed the need to determine the extent of involvement of not only the left breast but to determine whether there was any suspicious findings in the right breast. An MRI will assist [place] with this evaluation. There is also a question of the mildly enlarged lymph node in the left axilla. This may need to be biopsied under ultrasound guidance as well. She understands that there is a risk of invasive cancer within the large area of involvement on the left breast. We discussed the importance of information from the lymph nodes and discussed sentinel lymph node biopsy. We also discussed surgical options. Since it appears that she has such a large area of her left breast involved, she would likely need to have a mastectomy with or without immediate reconstruction. She seems to be interested in bilateral mastectomies with immediate reconstruction.  We discussed the fact that her prognostic panel is not yet complete. She has no family history of breast cancer but at her age with one sister, she would likely benefit from genetic counseling. We will go ahead and place this referral as well as an oncology referral. MRI has been ordered. If the patient needs to undergo mastectomy with reconstruction we will refer her to the appropriate plastic surgeon.  Total counseling time 45 minutes   Impression:  High grade DCIS left breast  Plan:  After thorough consultation and genetic testing, the patient has decided to pursue left mastectomy with sentinel lymph node biopsy and prophylactic right mastectomy, with immediate reconstruction by Dr. Harlow Mares.  The surgical procedure has  been discussed with the patient.  Potential risks, benefits, alternative treatments, and expected outcomes have been explained.  All of the patient's questions at this time have been answered.  The likelihood of reaching  the patient's treatment goal is good.  The patient understand the proposed surgical procedure and wishes to proceed.  Imogene Burn. Georgette Dover, MD, [facility]  General/ Trauma Surgery  03/18/2015 3:32 PM

## 2015-03-30 ENCOUNTER — Telehealth: Payer: Self-pay | Admitting: *Deleted

## 2015-03-30 NOTE — Telephone Encounter (Signed)
Pt called with some concerns regarding Dr. Harlow Mares office and some concerns about her plastic surgery. Pt asked for recommendation for second opinion. Gave pt information on Drs. Thimmappa and Dillingham. Informed pt to review their information and call me back with a decision. Received verbal confirmation. Contact information provided.

## 2015-04-02 ENCOUNTER — Other Ambulatory Visit (HOSPITAL_COMMUNITY): Payer: Self-pay | Admitting: Plastic Surgery

## 2015-04-07 ENCOUNTER — Encounter (HOSPITAL_COMMUNITY)
Admission: RE | Admit: 2015-04-07 | Discharge: 2015-04-07 | Disposition: A | Discharge status: Home / Self Care | Payer: BLUE CROSS/BLUE SHIELD | Source: Ambulatory Visit | Attending: Surgery | Admitting: Surgery

## 2015-04-07 ENCOUNTER — Encounter (HOSPITAL_COMMUNITY): Payer: Self-pay

## 2015-04-07 HISTORY — DX: Pain in left knee: M25.562

## 2015-04-07 HISTORY — DX: Fibromyalgia: M79.7

## 2015-04-07 HISTORY — DX: Personal history of other diseases of the respiratory system: Z87.09

## 2015-04-07 HISTORY — DX: Gastro-esophageal reflux disease without esophagitis: K21.9

## 2015-04-07 HISTORY — DX: Headache, unspecified: R51.9

## 2015-04-07 HISTORY — DX: Personal history of other mental and behavioral disorders: Z86.59

## 2015-04-07 HISTORY — DX: Other chronic pain: G89.29

## 2015-04-07 HISTORY — DX: Personal history of diseases of the blood and blood-forming organs and certain disorders involving the immune mechanism: Z86.2

## 2015-04-07 HISTORY — DX: Other cervical disc degeneration, unspecified cervical region: M50.30

## 2015-04-07 HISTORY — DX: Urgency of urination: R39.15

## 2015-04-07 HISTORY — DX: Headache: R51

## 2015-04-07 HISTORY — DX: Panic disorder (episodic paroxysmal anxiety): F41.0

## 2015-04-07 HISTORY — DX: Anxiety disorder, unspecified: F41.9

## 2015-04-07 HISTORY — DX: Attention-deficit hyperactivity disorder, unspecified type: F90.9

## 2015-04-07 HISTORY — DX: Plantar fascial fibromatosis: M72.2

## 2015-04-07 LAB — BASIC METABOLIC PANEL
ANION GAP: 5 (ref 5–15)
BUN: 8 mg/dL (ref 6–20)
CHLORIDE: 104 mmol/L (ref 101–111)
CO2: 26 mmol/L (ref 22–32)
Calcium: 9.4 mg/dL (ref 8.9–10.3)
Creatinine, Ser: 0.75 mg/dL (ref 0.44–1.00)
GFR calc non Af Amer: >60 mL/min (ref >60)
Glucose, Bld: 103 mg/dL — ABNORMAL HIGH (ref 65–99)
Potassium: 4 mmol/L (ref 3.5–5.1)
Sodium: 135 mmol/L (ref 135–145)

## 2015-04-07 LAB — HCG, SERUM, QUALITATIVE: Preg, Serum: NEGATIVE

## 2015-04-07 LAB — CBC
HEMATOCRIT: 41.9 % (ref 36.0–46.0)
HEMOGLOBIN: 14.2 g/dL (ref 12.0–15.0)
MCH: 30.2 pg (ref 26.0–34.0)
MCHC: 33.9 g/dL (ref 30.0–36.0)
MCV: 89.1 fL (ref 78.0–100.0)
Platelets: 274 10*3/uL (ref 150–400)
RBC: 4.7 MIL/uL (ref 3.87–5.11)
RDW: 12 % (ref 11.5–15.5)
WBC: 7.9 10*3/uL (ref 4.0–10.5)

## 2015-04-07 NOTE — Pre-Procedure Instructions (Signed)
    [name]  04/07/2015      [id number] - [place], [place] - [address] [place] [address] Phone: [phone] Fax: [phone]    Your procedure is scheduled on Thursday, November 17th, 2016.  Report to [facility] Admitting at 9:15 A.M.  Call this number if you have problems the morning of surgery:  [phone]   Remember:  Do not eat food or drink liquids after midnight.  Take these medicines the morning of surgery with A SIP OF WATER: Acetaminophen (Tylenol) if needed, Hydrocodone-acetaminophen (Norco) if needed.  Stop taking: Aspirin, NSAIDs, Aleve, Naproxen, Ibuprofen, Advil, Motrin, BC's, Goody's, Fish oil, all herbal medications, and all vitamins.    Do not wear jewelry, make-up or nail polish.  Do not wear lotions, powders, or perfumes.  You may NOT wear deodorant.  Do not shave 48 hours prior to surgery.    Do not bring valuables to the hospital.  [facility] is not responsible for any belongings or valuables.  Contacts, dentures or bridgework may not be worn into surgery.  Leave your suitcase in the car.  After surgery it may be brought to your room.  For patients admitted to the hospital, discharge time will be determined by your treatment team.  Patients discharged the day of surgery will not be allowed to drive home.   Special instructions:  See attached.   Please read over the following fact sheets that you were given. Pain Booklet, Coughing and Deep Breathing and Surgical Site Infection Prevention

## 2015-04-07 NOTE — Progress Notes (Signed)
PCP - Dr. Adline Mango Cardiologist - denies  EKG/CXR - denies Echo/stress test/Cardiac cath - denies  Patient denies chest pain and shortness of breath at PAT appointment.

## 2015-04-08 MED ORDER — CEFAZOLIN SODIUM-DEXTROSE 2-3 GM-% IV SOLR
2.0000 g | INTRAVENOUS | Status: AC
  Administered 2015-04-09 (×2): 2 g via INTRAVENOUS
  Filled 2015-04-08: qty 50

## 2015-04-08 MED ORDER — CHLORHEXIDINE GLUCONATE 4 % EX LIQD: 1.0000 "application " | Freq: Once | CUTANEOUS | Status: DC

## 2015-04-08 MED ORDER — HEPARIN SODIUM (PORCINE) 5000 UNIT/ML IJ SOLN
5000.0000 [IU] | Freq: Once | INTRAMUSCULAR | Status: AC
  Administered 2015-04-09: 5000 [IU] via SUBCUTANEOUS
  Filled 2015-04-08: qty 1

## 2015-04-09 ENCOUNTER — Encounter (HOSPITAL_COMMUNITY): Payer: Self-pay | Admitting: Certified Registered Nurse Anesthetist

## 2015-04-09 ENCOUNTER — Inpatient Hospital Stay (HOSPITAL_COMMUNITY): Payer: BLUE CROSS/BLUE SHIELD | Admitting: Certified Registered Nurse Anesthetist

## 2015-04-09 ENCOUNTER — Encounter (HOSPITAL_COMMUNITY)
Admission: RE | Disposition: A | Discharge status: Home / Self Care | Payer: Self-pay | Source: Ambulatory Visit | Attending: Surgery

## 2015-04-09 ENCOUNTER — Encounter (HOSPITAL_COMMUNITY)
Admission: RE | Admit: 2015-04-09 | Discharge: 2015-04-09 | Disposition: A | Discharge status: Home / Self Care | Payer: BLUE CROSS/BLUE SHIELD | Source: Ambulatory Visit | Attending: Surgery | Admitting: Surgery

## 2015-04-09 ENCOUNTER — Telehealth: Payer: Self-pay | Admitting: Hematology and Oncology

## 2015-04-09 ENCOUNTER — Inpatient Hospital Stay (HOSPITAL_COMMUNITY)
Admission: RE | Admit: 2015-04-09 | Discharge: 2015-04-11 | DRG: 578 | Disposition: A | Discharge status: Home / Self Care | Payer: BLUE CROSS/BLUE SHIELD | Source: Ambulatory Visit | Attending: Surgery | Admitting: Surgery

## 2015-04-09 ENCOUNTER — Other Ambulatory Visit: Payer: Self-pay | Admitting: *Deleted

## 2015-04-09 DIAGNOSIS — M797 Fibromyalgia: Secondary | ICD-10-CM | POA: Diagnosis present

## 2015-04-09 DIAGNOSIS — D0510 Intraductal carcinoma in situ of unspecified breast: Secondary | ICD-10-CM | POA: Diagnosis present

## 2015-04-09 DIAGNOSIS — Z87891 Personal history of nicotine dependence: Secondary | ICD-10-CM

## 2015-04-09 DIAGNOSIS — D0512 Intraductal carcinoma in situ of left breast: Secondary | ICD-10-CM | POA: Diagnosis present

## 2015-04-09 DIAGNOSIS — Z4001 Encounter for prophylactic removal of breast: Secondary | ICD-10-CM | POA: Diagnosis present

## 2015-04-09 DIAGNOSIS — Z79899 Other long term (current) drug therapy: Secondary | ICD-10-CM | POA: Diagnosis not present

## 2015-04-09 DIAGNOSIS — C50919 Malignant neoplasm of unspecified site of unspecified female breast: Secondary | ICD-10-CM | POA: Diagnosis present

## 2015-04-09 DIAGNOSIS — C50912 Malignant neoplasm of unspecified site of left female breast: Secondary | ICD-10-CM | POA: Insufficient documentation

## 2015-04-09 HISTORY — PX: BREAST RECONSTRUCTION WITH PLACEMENT OF TISSUE EXPANDER AND FLEX HD (ACELLULAR HYDRATED DERMIS): SHX6295

## 2015-04-09 HISTORY — PX: MASTECTOMY W/ SENTINEL NODE BIOPSY: SHX2001

## 2015-04-09 LAB — CBC
HCT: 33.6 % — ABNORMAL LOW (ref 36.0–46.0)
Hemoglobin: 11.4 g/dL — ABNORMAL LOW (ref 12.0–15.0)
MCH: 30.2 pg (ref 26.0–34.0)
MCHC: 33.9 g/dL (ref 30.0–36.0)
MCV: 89.1 fL (ref 78.0–100.0)
PLATELETS: 227 10*3/uL (ref 150–400)
RBC: 3.77 MIL/uL — ABNORMAL LOW (ref 3.87–5.11)
RDW: 12 % (ref 11.5–15.5)
WBC: 14.9 10*3/uL — ABNORMAL HIGH (ref 4.0–10.5)

## 2015-04-09 LAB — CREATININE, SERUM
CREATININE: 0.83 mg/dL (ref 0.44–1.00)
GFR calc Af Amer: >60 mL/min (ref >60)
GFR calc non Af Amer: >60 mL/min (ref >60)

## 2015-04-09 SURGERY — MASTECTOMY WITH SENTINEL LYMPH NODE BIOPSY
Anesthesia: Regional | Site: Breast | Laterality: Bilateral

## 2015-04-09 MED ORDER — ALBUMIN HUMAN 5 % IV SOLN
INTRAVENOUS | Status: DC | PRN
Start: 2015-04-09 — End: 2015-04-09
  Administered 2015-04-09: 14:00:00 via INTRAVENOUS

## 2015-04-09 MED ORDER — SODIUM CHLORIDE 0.9 % IJ SOLN
INTRAMUSCULAR | Status: AC
  Filled 2015-04-09: qty 10

## 2015-04-09 MED ORDER — ACETAMINOPHEN 650 MG RE SUPP: 650.0000 mg | Freq: Four times a day (QID) | RECTAL | Status: DC | PRN

## 2015-04-09 MED ORDER — ONDANSETRON HCL 4 MG/2ML IJ SOLN: 4.0000 mg | Freq: Four times a day (QID) | INTRAMUSCULAR | Status: DC | PRN

## 2015-04-09 MED ORDER — EPHEDRINE SULFATE 50 MG/ML IJ SOLN
INTRAMUSCULAR | Status: AC
  Filled 2015-04-09: qty 1

## 2015-04-09 MED ORDER — SODIUM CHLORIDE 0.9 % IR SOLN
Status: DC | PRN
  Administered 2015-04-09: 1

## 2015-04-09 MED ORDER — FENTANYL CITRATE (PF) 250 MCG/5ML IJ SOLN
INTRAMUSCULAR | Status: AC
  Filled 2015-04-09: qty 5

## 2015-04-09 MED ORDER — OXYCODONE HCL 5 MG PO TABS: 5.0000 mg | ORAL_TABLET | Freq: Once | ORAL | Status: DC | PRN

## 2015-04-09 MED ORDER — PROMETHAZINE HCL 25 MG/ML IJ SOLN
6.2500 mg | INTRAMUSCULAR | Status: DC | PRN
Start: 2015-04-09 — End: 2015-04-09
  Administered 2015-04-09: 6.25 mg via INTRAVENOUS

## 2015-04-09 MED ORDER — MIDAZOLAM HCL 2 MG/2ML IJ SOLN
INTRAMUSCULAR | Status: AC
  Filled 2015-04-09: qty 2

## 2015-04-09 MED ORDER — 0.9 % SODIUM CHLORIDE (POUR BTL) OPTIME
TOPICAL | Status: DC | PRN
  Administered 2015-04-09 (×3): 1000 mL

## 2015-04-09 MED ORDER — PROPOFOL 10 MG/ML IV BOLUS
INTRAVENOUS | Status: DC | PRN
  Administered 2015-04-09: 200 mg via INTRAVENOUS

## 2015-04-09 MED ORDER — METHOCARBAMOL 500 MG PO TABS
500.0000 mg | ORAL_TABLET | Freq: Four times a day (QID) | ORAL | Status: DC | PRN
  Administered 2015-04-09: 500 mg via ORAL

## 2015-04-09 MED ORDER — METHYLENE BLUE 1 % INJ SOLN
INTRAMUSCULAR | Status: AC
  Filled 2015-04-09: qty 10

## 2015-04-09 MED ORDER — PROPOFOL 10 MG/ML IV BOLUS
INTRAVENOUS | Status: AC
  Filled 2015-04-09: qty 20

## 2015-04-09 MED ORDER — DOCUSATE SODIUM 100 MG PO CAPS: 100.0000 mg | ORAL_CAPSULE | Freq: Every day | ORAL | Status: DC

## 2015-04-09 MED ORDER — ROCURONIUM BROMIDE 50 MG/5ML IV SOLN
INTRAVENOUS | Status: AC
  Filled 2015-04-09: qty 1

## 2015-04-09 MED ORDER — ZOLPIDEM TARTRATE 5 MG PO TABS: 5.0000 mg | ORAL_TABLET | Freq: Every evening | ORAL | Status: DC | PRN

## 2015-04-09 MED ORDER — OXYCODONE HCL 5 MG/5ML PO SOLN: 5.0000 mg | Freq: Once | ORAL | Status: DC | PRN

## 2015-04-09 MED ORDER — FENTANYL CITRATE (PF) 100 MCG/2ML IJ SOLN
INTRAMUSCULAR | Status: DC | PRN
  Administered 2015-04-09: 150 ug via INTRAVENOUS
  Administered 2015-04-09 (×5): 50 ug via INTRAVENOUS

## 2015-04-09 MED ORDER — ONDANSETRON 4 MG PO TBDP: 4.0000 mg | ORAL_TABLET | Freq: Four times a day (QID) | ORAL | Status: DC | PRN

## 2015-04-09 MED ORDER — ACETAMINOPHEN 325 MG PO TABS: 650.0000 mg | ORAL_TABLET | Freq: Four times a day (QID) | ORAL | Status: DC | PRN

## 2015-04-09 MED ORDER — PHENYLEPHRINE HCL 10 MG/ML IJ SOLN
INTRAMUSCULAR | Status: DC | PRN
  Administered 2015-04-09: 80 ug via INTRAVENOUS
  Administered 2015-04-09: 40 ug via INTRAVENOUS

## 2015-04-09 MED ORDER — ARTIFICIAL TEARS OP OINT
TOPICAL_OINTMENT | OPHTHALMIC | Status: DC | PRN
  Administered 2015-04-09: 1 via OPHTHALMIC

## 2015-04-09 MED ORDER — PHENYLEPHRINE 40 MCG/ML (10ML) SYRINGE FOR IV PUSH (FOR BLOOD PRESSURE SUPPORT)
PREFILLED_SYRINGE | INTRAVENOUS | Status: AC
  Filled 2015-04-09: qty 10

## 2015-04-09 MED ORDER — BUPIVACAINE-EPINEPHRINE (PF) 0.25% -1:200000 IJ SOLN
INTRAMUSCULAR | Status: DC | PRN
  Administered 2015-04-09 (×2): 30 mL

## 2015-04-09 MED ORDER — METHYLENE BLUE 1 % INJ SOLN
INTRAMUSCULAR | Status: DC | PRN
  Administered 2015-04-09: 5 mL via INTRAMUSCULAR

## 2015-04-09 MED ORDER — FENTANYL CITRATE (PF) 100 MCG/2ML IJ SOLN
100.0000 ug | Freq: Once | INTRAMUSCULAR | Status: AC
  Administered 2015-04-09: 100 ug via INTRAVENOUS

## 2015-04-09 MED ORDER — ENOXAPARIN SODIUM 40 MG/0.4ML ~~LOC~~ SOLN
40.0000 mg | SUBCUTANEOUS | Status: DC
  Administered 2015-04-10 – 2015-04-11 (×2): 40 mg via SUBCUTANEOUS
  Filled 2015-04-09 (×3): qty 0.4

## 2015-04-09 MED ORDER — SIMETHICONE 80 MG PO CHEW: 40.0000 mg | CHEWABLE_TABLET | Freq: Four times a day (QID) | ORAL | Status: DC | PRN

## 2015-04-09 MED ORDER — DEXAMETHASONE SODIUM PHOSPHATE 4 MG/ML IJ SOLN
INTRAMUSCULAR | Status: AC
  Filled 2015-04-09: qty 2

## 2015-04-09 MED ORDER — PROMETHAZINE HCL 25 MG/ML IJ SOLN: 6.2500 mg | INTRAMUSCULAR | Status: DC | PRN

## 2015-04-09 MED ORDER — ALPRAZOLAM 0.5 MG PO TABS: 0.7500 mg | ORAL_TABLET | Freq: Every evening | ORAL | Status: DC | PRN

## 2015-04-09 MED ORDER — MIDAZOLAM HCL 2 MG/2ML IJ SOLN: 2.0000 mg | Freq: Once | INTRAMUSCULAR | Status: DC

## 2015-04-09 MED ORDER — OXYCODONE-ACETAMINOPHEN 5-325 MG PO TABS
ORAL_TABLET | ORAL | Status: AC
  Filled 2015-04-09: qty 1

## 2015-04-09 MED ORDER — MIDAZOLAM HCL 2 MG/2ML IJ SOLN
INTRAMUSCULAR | Status: AC
  Administered 2015-04-09: 2 mg
  Filled 2015-04-09: qty 2

## 2015-04-09 MED ORDER — KETOROLAC TROMETHAMINE 30 MG/ML IJ SOLN
INTRAMUSCULAR | Status: AC
  Filled 2015-04-09: qty 1

## 2015-04-09 MED ORDER — SODIUM CHLORIDE 0.9 % IV SOLN
INTRAVENOUS | Status: DC
  Filled 2015-04-09 (×3): qty 1

## 2015-04-09 MED ORDER — GLYCOPYRROLATE 0.2 MG/ML IJ SOLN
INTRAMUSCULAR | Status: DC | PRN
  Administered 2015-04-09: 0.4 mg via INTRAVENOUS

## 2015-04-09 MED ORDER — DEXAMETHASONE SODIUM PHOSPHATE 4 MG/ML IJ SOLN
INTRAMUSCULAR | Status: DC | PRN
  Administered 2015-04-09: 4 mg via INTRAVENOUS

## 2015-04-09 MED ORDER — CEFAZOLIN SODIUM-DEXTROSE 2-3 GM-% IV SOLR
2.0000 g | Freq: Three times a day (TID) | INTRAVENOUS | Status: AC
  Administered 2015-04-09: 2 g via INTRAVENOUS
  Filled 2015-04-09: qty 50

## 2015-04-09 MED ORDER — KETOROLAC TROMETHAMINE 30 MG/ML IJ SOLN
30.0000 mg | Freq: Once | INTRAMUSCULAR | Status: AC
  Administered 2015-04-09: 30 mg via INTRAVENOUS

## 2015-04-09 MED ORDER — NEOSTIGMINE METHYLSULFATE 10 MG/10ML IV SOLN
INTRAVENOUS | Status: DC | PRN
  Administered 2015-04-09: 3 mg via INTRAVENOUS

## 2015-04-09 MED ORDER — HYDROMORPHONE HCL 1 MG/ML IJ SOLN
INTRAMUSCULAR | Status: AC
  Filled 2015-04-09: qty 1

## 2015-04-09 MED ORDER — ONDANSETRON HCL 4 MG/2ML IJ SOLN
INTRAMUSCULAR | Status: DC | PRN
  Administered 2015-04-09 (×2): 4 mg via INTRAVENOUS

## 2015-04-09 MED ORDER — DIPHENHYDRAMINE HCL 25 MG PO CAPS: 25.0000 mg | ORAL_CAPSULE | Freq: Four times a day (QID) | ORAL | Status: DC | PRN

## 2015-04-09 MED ORDER — PROMETHAZINE HCL 25 MG/ML IJ SOLN
INTRAMUSCULAR | Status: AC
  Filled 2015-04-09: qty 1

## 2015-04-09 MED ORDER — PHENYLEPHRINE HCL 10 MG/ML IJ SOLN
10.0000 mg | INTRAVENOUS | Status: DC | PRN
  Administered 2015-04-09: 25 ug/min via INTRAVENOUS

## 2015-04-09 MED ORDER — METHOCARBAMOL 500 MG PO TABS
ORAL_TABLET | ORAL | Status: AC
  Filled 2015-04-09: qty 1

## 2015-04-09 MED ORDER — ROCURONIUM BROMIDE 100 MG/10ML IV SOLN
INTRAVENOUS | Status: DC | PRN
Start: 2015-04-09 — End: 2015-04-09
  Administered 2015-04-09: 20 mg via INTRAVENOUS
  Administered 2015-04-09: 10 mg via INTRAVENOUS
  Administered 2015-04-09: 50 mg via INTRAVENOUS
  Administered 2015-04-09: 20 mg via INTRAVENOUS
  Administered 2015-04-09: 30 mg via INTRAVENOUS

## 2015-04-09 MED ORDER — HYDROMORPHONE HCL 1 MG/ML IJ SOLN
1.0000 mg | INTRAMUSCULAR | Status: DC | PRN
  Administered 2015-04-09: 1 mg via INTRAVENOUS
  Filled 2015-04-09: qty 1

## 2015-04-09 MED ORDER — OXYCODONE-ACETAMINOPHEN 5-325 MG PO TABS
1.0000 | ORAL_TABLET | ORAL | Status: DC | PRN
  Administered 2015-04-09: 1 via ORAL
  Administered 2015-04-10: 2 via ORAL
  Filled 2015-04-09: qty 2

## 2015-04-09 MED ORDER — POTASSIUM CHLORIDE IN NACL 20-0.9 MEQ/L-% IV SOLN
INTRAVENOUS | Status: DC
  Administered 2015-04-09: 21:00:00 via INTRAVENOUS
  Filled 2015-04-09: qty 1000

## 2015-04-09 MED ORDER — LIDOCAINE HCL (CARDIAC) 20 MG/ML IV SOLN
INTRAVENOUS | Status: AC
  Filled 2015-04-09: qty 5

## 2015-04-09 MED ORDER — CEFAZOLIN SODIUM-DEXTROSE 2-3 GM-% IV SOLR
2.0000 g | Freq: Four times a day (QID) | INTRAVENOUS | Status: DC
  Filled 2015-04-09 (×2): qty 50

## 2015-04-09 MED ORDER — LACTATED RINGERS IV SOLN
INTRAVENOUS | Status: DC
  Administered 2015-04-09 (×3): via INTRAVENOUS

## 2015-04-09 MED ORDER — DOCUSATE SODIUM 100 MG PO CAPS
100.0000 mg | ORAL_CAPSULE | Freq: Two times a day (BID) | ORAL | Status: DC
  Administered 2015-04-09 – 2015-04-11 (×4): 100 mg via ORAL
  Filled 2015-04-09 (×4): qty 1

## 2015-04-09 MED ORDER — ACETAMINOPHEN 325 MG PO TABS
650.0000 mg | ORAL_TABLET | Freq: Four times a day (QID) | ORAL | Status: DC | PRN
Start: 2015-04-09 — End: 2015-04-09

## 2015-04-09 MED ORDER — SODIUM CHLORIDE 0.9 % IV SOLN
INTRAVENOUS | Status: DC | PRN
  Administered 2015-04-09 (×2): 1000 mL

## 2015-04-09 MED ORDER — CEFAZOLIN SODIUM-DEXTROSE 2-3 GM-% IV SOLR
2.0000 g | Freq: Four times a day (QID) | INTRAVENOUS | Status: DC
  Administered 2015-04-09 – 2015-04-10 (×2): 2 g via INTRAVENOUS
  Filled 2015-04-09 (×5): qty 50

## 2015-04-09 MED ORDER — DIPHENHYDRAMINE HCL 50 MG/ML IJ SOLN
25.0000 mg | Freq: Four times a day (QID) | INTRAMUSCULAR | Status: DC | PRN
Start: 2015-04-09 — End: 2015-04-11

## 2015-04-09 MED ORDER — ONDANSETRON HCL 4 MG/2ML IJ SOLN
INTRAMUSCULAR | Status: AC
  Filled 2015-04-09: qty 2

## 2015-04-09 MED ORDER — HYDROMORPHONE HCL 1 MG/ML IJ SOLN
0.2500 mg | INTRAMUSCULAR | Status: DC | PRN
  Administered 2015-04-09 (×2): 0.5 mg via INTRAVENOUS

## 2015-04-09 MED ORDER — FENTANYL CITRATE (PF) 100 MCG/2ML IJ SOLN
INTRAMUSCULAR | Status: AC
  Administered 2015-04-09: 100 ug via INTRAVENOUS
  Filled 2015-04-09: qty 2

## 2015-04-09 MED ORDER — CEFAZOLIN SODIUM-DEXTROSE 2-3 GM-% IV SOLR
INTRAVENOUS | Status: AC
  Filled 2015-04-09: qty 50

## 2015-04-09 MED ORDER — MIDAZOLAM HCL 2 MG/2ML IJ SOLN: 1.0000 mg | Freq: Once | INTRAMUSCULAR | Status: DC

## 2015-04-09 MED ORDER — TECHNETIUM TC 99M SULFUR COLLOID FILTERED
1.0000 | Freq: Once | INTRAVENOUS | Status: AC | PRN
  Administered 2015-04-09: 1 via INTRADERMAL

## 2015-04-09 MED ORDER — MIDAZOLAM HCL 2 MG/2ML IJ SOLN
INTRAMUSCULAR | Status: AC
  Administered 2015-04-09: 1 mg
  Filled 2015-04-09: qty 2

## 2015-04-09 SURGICAL SUPPLY — 79 items
APPLIER CLIP 9.375 MED OPEN (MISCELLANEOUS) ×6
ATCH SMKEVC FLXB CAUT HNDSWH (FILTER) ×1 IMPLANT
BAG DECANTER FOR FLEXI CONT (MISCELLANEOUS) ×6 IMPLANT
BINDER BREAST LRG (GAUZE/BANDAGES/DRESSINGS) IMPLANT
BINDER BREAST XLRG (GAUZE/BANDAGES/DRESSINGS) ×3 IMPLANT
BIOPATCH RED 1 DISK 7.0 (GAUZE/BANDAGES/DRESSINGS) ×8 IMPLANT
BIOPATCH RED 1IN DISK 7.0MM (GAUZE/BANDAGES/DRESSINGS) ×4
BLADE 10 SAFETY STRL DISP (BLADE) IMPLANT
BLADE SURG 15 STRL LF DISP TIS (BLADE) ×1 IMPLANT
BLADE SURG 15 STRL SS (BLADE) ×2
CANISTER SUCTION 2500CC (MISCELLANEOUS) ×6 IMPLANT
CHLORAPREP W/TINT 26ML (MISCELLANEOUS) ×3 IMPLANT
CLIP APPLIE 9.375 MED OPEN (MISCELLANEOUS) ×2 IMPLANT
CONT SPEC 4OZ CLIKSEAL STRL BL (MISCELLANEOUS) IMPLANT
COVER PROBE W GEL 5X96 (DRAPES) ×3 IMPLANT
COVER SURGICAL LIGHT HANDLE (MISCELLANEOUS) ×6 IMPLANT
DERMABOND ADVANCED (GAUZE/BANDAGES/DRESSINGS) ×6
DERMABOND ADVANCED .7 DNX12 (GAUZE/BANDAGES/DRESSINGS) ×3 IMPLANT
DRAIN CHANNEL 19F RND (DRAIN) ×15 IMPLANT
DRAPE CHEST BREAST 15X10 FENES (DRAPES) IMPLANT
DRAPE ORTHO SPLIT 77X108 STRL (DRAPES) ×4
DRAPE PROXIMA HALF (DRAPES) ×9 IMPLANT
DRAPE SURG 17X23 STRL (DRAPES) ×12 IMPLANT
DRAPE SURG ORHT 6 SPLT 77X108 (DRAPES) ×2 IMPLANT
DRAPE WARM FLUID 44X44 (DRAPE) ×3 IMPLANT
DRSG PAD ABDOMINAL 8X10 ST (GAUZE/BANDAGES/DRESSINGS) ×12 IMPLANT
DRSG SORBAVIEW 3.5X5-5/16 MED (GAUZE/BANDAGES/DRESSINGS) ×15 IMPLANT
ELECT BLADE 4.0 EZ CLEAN MEGAD (MISCELLANEOUS) ×3
ELECT BLADE 6.5 EXT (BLADE) ×3 IMPLANT
ELECT CAUTERY BLADE 6.4 (BLADE) ×6 IMPLANT
ELECT REM PT RETURN 9FT ADLT (ELECTROSURGICAL) ×3
ELECTRODE BLDE 4.0 EZ CLN MEGD (MISCELLANEOUS) ×1 IMPLANT
ELECTRODE REM PT RTRN 9FT ADLT (ELECTROSURGICAL) ×1 IMPLANT
EVACUATOR SILICONE 100CC (DRAIN) ×15 IMPLANT
EVACUATOR SMOKE ACCUVAC VALLEY (FILTER) ×2
EXPANDER BREAST ARTOURA 600CC (Breast) ×6 IMPLANT
GAUZE SPONGE 4X4 12PLY STRL (GAUZE/BANDAGES/DRESSINGS) IMPLANT
GLOVE BIO SURGEON STRL SZ7 (GLOVE) ×3 IMPLANT
GLOVE BIO SURGEON STRL SZ7.5 (GLOVE) ×3 IMPLANT
GLOVE BIO SURGEON STRL SZ8.5 (GLOVE) ×6 IMPLANT
GLOVE BIOGEL PI IND STRL 6.5 (GLOVE) ×2 IMPLANT
GLOVE BIOGEL PI IND STRL 7.5 (GLOVE) ×1 IMPLANT
GLOVE BIOGEL PI IND STRL 8 (GLOVE) ×1 IMPLANT
GLOVE BIOGEL PI IND STRL 8.5 (GLOVE) ×2 IMPLANT
GLOVE BIOGEL PI INDICATOR 6.5 (GLOVE) ×4
GLOVE BIOGEL PI INDICATOR 7.5 (GLOVE) ×2
GLOVE BIOGEL PI INDICATOR 8 (GLOVE) ×2
GLOVE BIOGEL PI INDICATOR 8.5 (GLOVE) ×4
GLOVE ECLIPSE 6.5 STRL STRAW (GLOVE) ×6 IMPLANT
GLOVE SURG SS PI 6.0 STRL IVOR (GLOVE) ×6 IMPLANT
GOWN STRL REUS W/ TWL LRG LVL3 (GOWN DISPOSABLE) ×3 IMPLANT
GOWN STRL REUS W/ TWL XL LVL3 (GOWN DISPOSABLE) ×3 IMPLANT
GOWN STRL REUS W/TWL LRG LVL3 (GOWN DISPOSABLE) ×6
GOWN STRL REUS W/TWL XL LVL3 (GOWN DISPOSABLE) ×6
GRAFT FLEX HD BILAT 4X16 THICK (Tissue Mesh) ×3 IMPLANT
KIT BASIN OR (CUSTOM PROCEDURE TRAY) ×6 IMPLANT
KIT ROOM TURNOVER OR (KITS) ×6 IMPLANT
MARKER SKIN DUAL TIP RULER LAB (MISCELLANEOUS) ×3 IMPLANT
NEEDLE 18GX1X1/2 (RX/OR ONLY) (NEEDLE) ×3 IMPLANT
NEEDLE HYPO 25GX1X1/2 BEV (NEEDLE) ×3 IMPLANT
NS IRRIG 1000ML POUR BTL (IV SOLUTION) ×9 IMPLANT
PACK GENERAL/GYN (CUSTOM PROCEDURE TRAY) ×6 IMPLANT
PAD ARMBOARD 7.5X6 YLW CONV (MISCELLANEOUS) ×6 IMPLANT
PREFILTER EVAC NS 1 1/3-3/8IN (MISCELLANEOUS) ×3 IMPLANT
SPECIMEN JAR X LARGE (MISCELLANEOUS) ×6 IMPLANT
SPONGE LAP 18X18 X RAY DECT (DISPOSABLE) ×3 IMPLANT
STAPLER VISISTAT 35W (STAPLE) ×3 IMPLANT
SUT ETHILON 2 0 FS 18 (SUTURE) IMPLANT
SUT MNCRL AB 3-0 PS2 18 (SUTURE) ×18 IMPLANT
SUT PDS AB 3-0 SH 27 (SUTURE) ×15 IMPLANT
SUT PROLENE 3 0 PS 2 (SUTURE) ×12 IMPLANT
SUT VIC AB 3-0 SH 18 (SUTURE) ×9 IMPLANT
SYR BULB IRRIGATION 50ML (SYRINGE) ×3 IMPLANT
SYR CONTROL 10ML LL (SYRINGE) ×3 IMPLANT
TOWEL OR 17X24 6PK STRL BLUE (TOWEL DISPOSABLE) ×3 IMPLANT
TOWEL OR 17X26 10 PK STRL BLUE (TOWEL DISPOSABLE) ×3 IMPLANT
TRAY FOLEY CATH 16FR SILVER (SET/KITS/TRAYS/PACK) ×3 IMPLANT
TUBE CONNECTING 12'X1/4 (SUCTIONS) ×1
TUBE CONNECTING 12X1/4 (SUCTIONS) ×2 IMPLANT

## 2015-04-09 NOTE — Anesthesia Postprocedure Evaluation (Signed)
  Anesthesia Post-op Note  Patient: [name]  Procedure(s) Performed: Procedure(s): LEFT MASTECTOMY WITH SENTINEL LEFT LYMPH NODE BIOPSY AND RIGHT PROPHYLACTIC MASTECTOMY (Bilateral) PLACEMENT OF BILATERAL TISSUE EXPANDER AND  USE OF ADM  (ACELLULAR DERMAL MATRIX) (Bilateral)  Patient Location: PACU  Anesthesia Type:General  Level of Consciousness: awake, alert  and oriented  Airway and Oxygen Therapy: Patient Spontanous Breathing and Patient connected to nasal cannula oxygen  Post-op Pain: mild  Post-op Assessment: Post-op Vital signs reviewed, Patient's Cardiovascular Status Stable, Respiratory Function Stable and Patent Airway              Post-op Vital Signs: stable  Last Vitals:  Filed Vitals:   04/09/15 1733  BP:   Pulse: 73  Temp:   Resp: 15    Complications: No apparent anesthesia complications

## 2015-04-09 NOTE — Op Note (Signed)
Pre-op Diagnosis:  Left breast DCIS Post-op Diagnosis:  Same Procedure:  1.  Blue dye injection   2.  Left mastectomy   3.  Left axillary sentinel lymph node biopsy   4.  Right prophylactic mastectomy Surgeon:  Maia Petties. 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  Genetic testing was negative.  She has undergone multiple consultations and has chosen to have bilateral mastectomies because of the density of the breast tissue and the anticipated difficulty in surveillance if she only had unilateral mastectomy.  She is to also undergo immediate reconstruction by Dr. Harlow Mares today.  Description of procedure:  The patient was brought to the operating room and placed in supine position on the operating table.  After an adequate level of anesthesia was obtained, I injected methylene blue dye solution around the left nipple.  She had previously been injected with technetium sulfur colloid by radiology in the holding area.  A foley catheter was placed under sterile technique.  Her entire chest was prepped with Chloraprep and draped in sterile fashion.  A time-out was taken to ensure the proper patient and proper procedure.      We began on the right side.  I outlined an elliptical incision around the nipple-areolar complex.  Skin incisions were made and we raised flaps superiorly to the infraclavicular chest wall, medially to the edge of the sternum, inferiorly to the inframammary crease, and laterally to the anterior edge of the latissimus muscle.  The breast tissue was then dissected from the chest wall from medial to lateral.  Hemostasis was maintained with cautery and hemaclips.  We irrigated thoroughly and inspected for hemostasis.  A moist sponge was placed into the wound.    We then turned our attention to the left side. I interrogated the axilla with the neoprobe was some activity. We made an  elliptical incision around the nipple areolar complex in similar fashion to the right side. We raised skin flaps using the same landmarks. We entered the axilla and I bluntly dissected down to a hot blue lymph node. There were 2 lymph nodes that seemed to be clustered together. I dissected these free and sent these for pathologic examination. There was no background activity after removing these lymph nodes. I then remove the breast tissue from medial to lateral using cautery for hemostasis as well as some clips. We irrigated thoroughly and inspected for hemostasis. A moist sponge was placed into the wound. The procedure was then turned over to Dr. Harlow Mares for reconstruction. At the end of my procedure, all sponge, initially, and needle counts are correct.   Imogene Burn. Georgette Dover, MD, [facility]  General/ Trauma Surgery  04/09/2015 2:20 PM

## 2015-04-09 NOTE — Interval H&P Note (Signed)
History and Physical Interval Note:  04/09/2015 10:55 AM  [name]  has presented today for surgery, with the diagnosis of LEFT BREAST DCIS  The various methods of treatment have been discussed with the patient and family. After consideration of risks, benefits and other options for treatment, the patient has consented to  Procedure(s): LEFT MASTECTOMY WITH SENTINEL LEFT LYMPH NODE BIOPSY AND RIGHT PROPHYLACTIC MASTECTOMY (Bilateral) PLACEMENT OF BILATERAL TISSUE EXPANDER AND POSSIBLE USE OF ADM  (ACELLULAR DERMAL MATRIX) (Bilateral) as a surgical intervention .  The patient's history has been reviewed, patient examined, no change in status, stable for surgery.  I have reviewed the patient's chart and labs.  Questions were answered to the patient's satisfaction.     Oshae Simmering K.

## 2015-04-09 NOTE — Brief Op Note (Signed)
04/09/2015  4:34 PM  PATIENT:  [name]  39 y.o. female  PRE-OPERATIVE DIAGNOSIS:  LEFT BREAST DCIS  POST-OPERATIVE DIAGNOSIS:  LEFT BREAST DCIS  PROCEDURE:  Procedure(s): LEFT MASTECTOMY WITH SENTINEL LEFT LYMPH NODE BIOPSY AND RIGHT PROPHYLACTIC MASTECTOMY (Bilateral) PLACEMENT OF BILATERAL TISSUE EXPANDER AND  USE OF ADM  (ACELLULAR DERMAL MATRIX) (Bilateral)  SURGEON:  Surgeon(s) and Role: Panel 1:    * Donnie Mesa, MD - Primary  Panel 2:    * Crissie Reese, MD - Primary  PHYSICIAN ASSISTANT:   ASSISTANTS: none   ANESTHESIA:   general  EBL:  Total I/O In: 2250 [I.V.:2000; IV Piggyback:250] Out: 650 [Urine:200; Blood:450]  BLOOD ADMINISTERED:none  DRAINS: (4) Jackson-Pratt drain(s) with closed bulb suction in the left chest (2) and right chest (2)   LOCAL MEDICATIONS USED:  NONE  SPECIMEN:  No Specimen  DISPOSITION OF SPECIMEN:  N/A  COUNTS:  YES  TOURNIQUET:  * No tourniquets in log *  DICTATION: .Other Dictation: Dictation Number [id number]  PLAN OF CARE: Admit for overnight observation  PATIENT DISPOSITION:  PACU - hemodynamically stable.   Delay start of Pharmacological VTE agent (>24hrs) due to surgical blood loss or risk of bleeding: yes

## 2015-04-09 NOTE — Op Note (Signed)
NAMEMICHAYLA, [name] NO.:  [id number]  MEDICAL RECORD NO.:  [id number]  LOCATION:  [id number]                        FACILITY:  [facility]  PHYSICIAN:  Crissie Reese, M.D.     DATE OF BIRTH:  [birth year]  DATE OF PROCEDURE:  04/09/2015 DATE OF DISCHARGE:                              OPERATIVE REPORT   PREOPERATIVE DIAGNOSIS:  Breast cancer.  POSTOPERATIVE DIAGNOSIS:  Breast cancer.  PROCEDURE PERFORMED: 1. Left breast reconstruction with tissue expander. 2. Distinct procedure, reconstruction of left chest wall with 110-cm     of acellular dermal matrix for inadequate muscle and reset of     inframammary fold. 3. Right breast reconstruction with tissue expander. 4. Distinct procedure, reconstruction of chest wall with acellular     dermal matrix for inadequate muscle and reset of inframammary fold.  SURGEON:  Crissie Reese, M.D.  ANESTHESIA:  General.  ESTIMATED BLOOD LOSS:  25 mL.  DRAINS:  Two 19-French drains on each side.  CLINICAL NOTE:  [name] has breast cancer, who is having bilateral mastectomy.  She desired reconstruction.  Options were discussed and she elected to have bilateral mastectomy with placement of tissue expanders.  The nature of procedure and risks, possible complications were discussed with her.  She understood the nature of the procedure as well as the risks that included, but not limited to, bleeding, infection, healing problems, scarring, loss of sensation, fluid accumulations, anesthesia-related complications, pneumothorax, DVT, PE, contour deformities, contour deformities of the periphery of reconstruction, failure of device, capsular contracture, displacement of device, wrinkles and ripples, asymmetry, chronic pain, and overall disappointment.  She understood that there was the possibility for the need for an acellular dermal matrix for adequate deep coverage of the tissue expanders.  She understood the staged nature of  these procedures and wished to proceed.  DESCRIPTION OF PROCEDURE:  The patient was in the operating room and the bilateral mastectomy completed.  The skin flaps appeared to be healthy. No evidence of any vascular compromise of the skin.  Irrigation with saline.  Muscles were elevated.  Muscles were inadequate for coverage of the tissue expanders.  Acellular dermal matrix was soaked in saline for greater than 10 minutes as well as antibiotic solution and was pie- crusted.  After thorough soaking and stripping the acellular dermal matrix of all fluid that was placed back in the soak.  After thoroughly cleaning gloves, tissue expanders were prepared.  The chest wall base of the wound measured approximately 14 cm and the 600 mL ARTOURA high- profile tissue expander was prepared.  The air was removed, 150 mL sterile saline placed using the closed filling system and expander was continued to soak in antibiotic solution.  The spaces were checked. Excellent hemostasis was confirmed.  Again after thoroughly cleaning gloves, expanders were positioned.  The care was taken to make sure they were oriented properly.  The acellular dermal matrix was then placed and care was taken to make sure that it was oriented with the dermal side facing upwards towards the mastectomy flaps.  It was secured around the periphery using 3-0 PDS sutures with great care taken to avoid damage to underlying tissue expanders, which  were kept under direct vision at all times.  Two 19-French drains were positioned on each side; one medial and one lateral and brought through separate stab wounds and secured with 3-0 Prolene sutures.  Tunnels were made subcutaneously greater than 5 cm for each drain.  The mastectomy flaps were then irrigated with saline and excellent hemostasis was confirmed.  The closures with 3-0 Monocryl interrupted inverted deep dermal sutures.  The drains were dressed with BioPatch and SorbaView dressings  and the skin closures were sealed with Dermabond.  Dry sterile dressings and the chest binder positioned and she was transported to the recovery room stable having tolerated the procedure well.     Crissie Reese, M.D.     DB/MEDQ  D:  04/09/2015  T:  04/09/2015  Job:  [id number]

## 2015-04-09 NOTE — Transfer of Care (Signed)
Immediate Anesthesia Transfer of Care Note  Patient: [name]  Procedure(s) Performed: Procedure(s): LEFT MASTECTOMY WITH SENTINEL LEFT LYMPH NODE BIOPSY AND RIGHT PROPHYLACTIC MASTECTOMY (Bilateral) PLACEMENT OF BILATERAL TISSUE EXPANDER AND  USE OF ADM  (ACELLULAR DERMAL MATRIX) (Bilateral)  Patient Location: PACU  Anesthesia Type:General  Level of Consciousness: awake, alert  and oriented  Airway & Oxygen Therapy: Patient Spontanous Breathing and Patient connected to nasal cannula oxygen  Post-op Assessment: Report given to RN and Post -op Vital signs reviewed and stable  Post vital signs: Reviewed and stable  Last Vitals:  Filed Vitals:   04/09/15 1123  BP: 137/68  Pulse: 119  Temp:   Resp: 15    Complications: No apparent anesthesia complications

## 2015-04-09 NOTE — H&P (View-Only) (Signed)
The patient is a 39 year old female who presents with breast cancer.   PCP - Dr. 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 She presents with several years of bilateral breast pain. The tenderness in her left breast seems to be worse over the last 2-3 months. In the last 2 or 3 months she noticed a lump that was palpable just to the left of her left nipple. One week ago she noticed some brownish drainage that was expressed from her nipple on the left. She has not noticed any discharge since that time. She underwent her first mammogram on 02/18/15. The left breast mammogram showed extensive calcifications predominantly involving the upper left breast measuring 10 x 12.5 x 9.5 cm. The breast tissue is quite dense. On the right there are some possible masses in the subareolar right breast and in the upper outer quadrant with some benign appearing calcifications noted. Left breast ultrasound showed an irregular hypoechoic branching mass measuring 4.5 x 2.3 x 1.1 cm. The upper part of the breasts shows diffusely abnormal tissue that is not able to be measured sonographically. The left axilla shows a prominent left axillary lymph node with a 4 mm thickened cortex. The right breast ultrasound showed multiple anechoic cyst and dilated ducts. There is a 1.4 x 0.8 x 0.8 cm complicated cyst at 2:00. The right axillary ultrasound is normal.  MRI was performed.  She underwent biopsy on 9/28 with two core biopsies at 12:00. The biopsies are 8.4 cm apart. 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 ER/ PR negative.  Further biopsies were performed of a suspicious area on the right breast as well as an enlarged left axillary lymph node.  The lymph node biopsy was  negative.    Breast, right, needle core biopsy, 12:00 o'clock NME - PSEUDOANGIOMATOUS STROMAL HYPERPLASIA (PASH). - FIBROCYSTIC CHANGES WITH USUAL DUCTAL HYPERPLASIA. - THERE IS NO EVIDENCE OF MALIGNANCY.  Genetic testing was negative.  She has consulted with Drs. [facility]ena, [facility], and Bowers.    Menarche - 13 First pregnancy - 17 Breastfeed - yes Hormones - Depo shots Family history - negative LMP - last week   CLINICAL DATA: 39-year-old female presenting for a palpable abnormality in the left breast adjacent to the nipple, as well as brownish left nipple discharge. She describes a palpable lump in her left axilla as well. She also describes" lumpiness" of the right breast, without definite focal abnormality.  EXAM: DIGITAL DIAGNOSTIC BILATERAL MAMMOGRAM WITH 3D TOMOSYNTHESIS AND CAD  BILATERAL BREAST ULTRASOUND  COMPARISON: Prior left breast ultrasounds. No prior mammograms available for comparison.  ACR Breast Density Category c: The breast tissue is heterogeneously dense, which may obscure small masses.  FINDINGS: Mammogram images the left breast demonstrate extensive calcifications predominantly involving the upper left breast but extending slightly below the level of the nipple on the MLO view. In axial dimension the calcifications span approximately 10.0 x 12.5 cm, and span 9.5 cm in the craniocaudal dimension. A palpable marker has been placed just medial to the nipple denoting an area of palpable concern. There is a mass deep to this palpable marker, however due to the density of the breast, margins are difficult to define making accurate measurement difficult.  Mammogram images of the right breast demonstrate possible   masses in the subareolar right breast and in the upper-outer quadrant of the right breast. Calcifications are noted, which layer on the ML view, compatible with benign milk of calcium. There are no suspicious calcifications or areas of  distortion identified in the right breast.  Mammographic images were processed with CAD.  Physical exam of the left breast at the palpable area of concern demonstrates a firm lumpy mass just medial to the nipple. Additionally, the superior aspect of the left breast, both medially and laterally, is abnormally firm, likely corresponding with the broad region of calcifications seen on mammogram. On physical exam of this firm area spans at least 10 cm. Physical exam of the left axilla demonstrates soft tissue without discrete mass at the site of the patient's palpable concern, however palpation does elicit tenderness.  Physical exam of the right breast demonstrates extensive lumpiness in the periareolar and lateral right breast.  Ultrasound targeted to the palpable area of concern in the left breast at 9 o'clock, 1 cm from the nipple demonstrates a irregular hypoechoic circumscribed branching mass measuring 4.5 x 2.3 x 1.1 cm. This may represent an intraductal mass versus a dilated duct filled with debris or blood. No definite blood flow seen within the mass. Ultrasound of the superior left breast imaged from the 9 o'clock to the 3 o'clock position and demonstrates diffusely abnormal tissue corresponding to the palpable abnormality on physical exam and to the location of the calcifications on mammography. Due to the irregular an insinuating appearance of the mass, this was not able to be measured sonographically.  Ultrasound of the left axilla at the area of palpable concern demonstrates a prominent left axillary lymph node with cortex measuring up to 4 mm. The patient describes tenderness at this site. Multiple other small normal-appearing lymph nodes are identified.  Ultrasound of the subareolar right breast demonstrates multiple anechoic cysts and significantly dilated ducts. These extend into the upper-outer quadrant of the right breast. At the 8 o'clock position, there is a  hypoechoic circumscribed mass measuring 1.8 x 0.7 x 1.4 cm. This may be within a duct. No blood flow seen within this mass. At 2 o'clock, 1 cm from the nipple there is a bilobed mass with both anechoic and hypoechoic areas internally, with an appearance suggesting a complicated cyst. This measures 1.4 x 0.8 x 0.8 cm.  Ultrasound of the right axilla demonstrates multiple normal-appearing lymph nodes with cortices measuring up to 3 mm.  IMPRESSION: 1. Extensive diffuse calcifications throughout the left breast, but predominantly in the upper outer and upper inner quadrants spanning up to 12.5 cm in the craniocaudal dimension. This is highly suspicious for at least DCIS.  2. The palpable area of concern in the left breast just medial to the nipple corresponds with a large irregular hypoechoic mass measuring at least 4.5 cm. No blood flow seen within the mass, and this could represent debris filling a duct versus an intraductal mass.  3. A prominent left axillary lymph node with mildly thickened cortex is identified at the tender palpable site in the left axilla.  4. Hypoechoic 1.8 cm circumscribed mass in the right breast at 8 o'clock may represent debris within the duct versus a intraductal mass.  5. Probable complicated cyst in the right breast at 2 o'clock, 1 cm from the nipple.  RECOMMENDATION: 1. Stereotactic biopsy is recommended for two distant sites of calcifications in the left breast. This has been scheduled for 02/18/2015 and 8 a.m.  2. Biopsy of the tender   left axillary lymph node with mildly thickened cortex with ultrasound guidance is recommended.  3. MRI of the bilateral breasts is recommended given the patient's age, extent of presumed disease in the left breast, questioned abnormalities in the right breast and density of the breast tissue. The results of the MRI may guide recommended biopsies of the right breast to the most suspicious findings. If no  additional sites of disease are seen in the right breast on the MRI, then ultrasound guided aspiration vs biopsy of the complicated cyst in the right breast at 2 o'clock and the possible mass or debris filled duct in the right breast at 8 o'clock.  I have discussed the findings and recommendations with the patient. Results were also provided in writing at the conclusion of the visit. If applicable, a reminder letter will be sent to the patient regarding the next appointment.  BI-RADS CATEGORY 5: Highly suggestive of malignancy.   Electronically Signed By: Michelle Collins M.D. On: 02/18/2015 12:32   CLINICAL DATA: 39-year-old female presenting for stereotactic biopsy of the left breast calcifications.  EXAM: LEFT BREAST STEREOTACTIC CORE NEEDLE BIOPSY  COMPARISON: Previous exams.  FINDINGS: The patient and I discussed the procedure of stereotactic-guided biopsy including benefits and alternatives. We discussed the high likelihood of a successful procedure. We discussed the risks of the procedure including infection, bleeding, tissue injury, clip migration, and inadequate sampling. Informed written consent was given. The usual time out protocol was performed immediately prior to the procedure.  Using sterile technique and 1% lidocaine as local anesthetic, under stereotactic guidance, a 9 gauge vacuum assisted device was used to perform core needle biopsy of calcifications in the superior left breast at 12 o'clock in the anterior depth using a superior approach. Specimen radiograph was performed showing calcifications in multiple specimens. Specimens with calcifications are identified for pathology.  At the conclusion of the procedure, a X shapedTissue marker clip was deployed into the biopsy cavity.  Next, the procedure was repeated the same technique as above to target the calcifications in the superior left breast at 12 o'clock, posterior depth using a superior  approach. Specimen radiograph was performed showing calcifications in multiple specimens. Specimens with calcifications were identified for pathology.  At the conclusion of the procedure, a coil shaped tissue marker clip was deployed into the biopsy cavity. Follow-up 2-view mammogram was performed and dictated separately.  IMPRESSION: Stereotactic-guided biopsy of the anterior and posterior aspect of calcifications in the superior left breast. No apparent complications.  Electronically Signed: By: Michelle Collins M.D. 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 This was found to be concordant by Dr. Michelle Collins. Pathology was discussed with the patient by telephone. She reported marked tenderness and mild bruising at the site. Post biopsy instructions and care were reviewed and her questions were answered. The patient requests surgical consultation in [place]. She has been scheduled with Dr. Barbarajean Kinzler at [facility] on February 23, 2015. A bilateral breast MRI is recommended to determine extent of disease and evaluation of the contralateral breast for guidance on additional sites of biopsy. If no sites are identified in the right breast are identified by MRI, then ultrasound guided aspiration/biopsy is recommended for the two abnormalities identified on recent ultrasound. Also note that left axillary lymph node biopsy has been previously recommended and will need to be scheduled after the MRI is completed. Pathology results reported by   Leigh Kuhnly RN, BSN on February 20, 2015. Electronically Signed By: Michelle Collins M.D. On: 02/20/2015 15:33   CLINICAL DATA: Post biopsy mammogram for clip placement.  EXAM: DIAGNOSTIC LEFT MAMMOGRAM POST STEREOTACTIC BIOPSY  COMPARISON: Previous  exam(s).  FINDINGS: Mammographic images were obtained following stereotactic guided biopsy of the anterior and posterior margins of the calcifications in the superior left breast. An X shaped biopsy marking clip is seen anteriorly and a coil shaped biopsy marking clip is seen posteriorly denoting the intended sites of biopsy. The clips in the MLO view lie 8.4 cm apart at the approximate 12 o'clock position.  IMPRESSION: Appropriate positioning of the 2 biopsy marking clips in the superior left breast at 12 o'clock. On this post biopsy mammogram, of view clips by 8.4 cm apart.  Final Assessment: Post Procedure Mammograms for Marker Placement   Electronically Signed By: Michelle Collins M.D. On: 02/18/2015 11:16   CLINICAL DATA: Patient is a 38-year-old female with newly diagnosed high-grade ductal carcinoma in situ of the left breast.  LABS: No labs today  EXAM: BILATERAL BREAST MRI WITH AND WITHOUT CONTRAST  TECHNIQUE: Multiplanar, multisequence MR images of both breasts were obtained prior to and following the intravenous administration of 15 ml of MultiHance.  THREE-DIMENSIONAL MR IMAGE RENDERING ON INDEPENDENT WORKSTATION:  Three-dimensional MR images were rendered by post-processing of the original MR data on an independent workstation. The three-dimensional MR images were interpreted, and findings are reported in the following complete MRI report for this study. Three dimensional images were evaluated at the independent DynaCad workstation  COMPARISON: Previous exam(s).  FINDINGS: Breast composition: Extreme fibroglandular tissue.  Background parenchymal enhancement: The multi parenchyma multi sequence bilateral breast MR demonstrates marked background parenchymal enhancement, asymmetric to the left breast.  Right breast: There are innumerable T2 bright cysts of varying size throughout the right breast. In addition, there are numerous dilated ducts  identified with nonenhancing T2 bright signal as well as variable T1 signal. No enhancing masses identified in the right breast, however there are multiple areas regions of clumped non mass enhancement.  There is a 5.6 cm AP diameter region of clumped non mass enhancement identified in the upper midline breast. Approximately 3 cm inferior to this, there is a 2.1 x 1.3 cm (AP by transverse) region of clumped non mass enhancement in the lower inner breast. Also, there are less conspicuous areas of regional enhancement involving the lower outer and lateral breast. Focal areas of clumped non mass enhancement are also noted in the subareolar and inferior breast at anterior depth. Some of this regional enhancement may be secondary to background parenchymal enhancement, however the more conspicuous regions in the upper midline and lower inner breast are suspicious.  Left breast: Examination demonstrates extensive clumped and clustered ring non mass enhancement throughout the left breast. This involves the majority of the superior breast extending inferiorly to the central breast involving both the lower outer and lower inner quadrants. In total, the non mass enhancement measures approximately 10.5 x 9.5 x 8.1 cm (AP by transverse by CC dimension). No focal masslike area is noted. Foci of signal dropout are identified in the posterior upper midline left breast and subareolar breast consistent with sites of biopsy. Innumerable sub cm T2 bright cysts are identified its scattered throughout the breast. Diffuse increased T2 signal abnormality is seen throughout the left breast likely secondary to edema. In addition, there is nonenhancing T1 bright/T2 dark material in the subareolar ducts, likely hemorrhage given patient history of nipple discharge.    Lymph nodes: Morphologically abnormal left axillary lymph nodes are identified, previously seen by ultrasound. No evidence of right axillary or  internal mammary adenopathy.  Ancillary findings: None.  IMPRESSION: 1. Non mass enhancement comprises greater than 50% of the left breast, measuring approximately 10.5 x 9.5 x 8.1 cm, consistent with biopsy-proven extensive high-grade ductal carcinoma in situ. 2. Left axillary adenopathy is suspicious for nodal disease. 3. Multiple areas of clumped non mass enhancement in the right breast are suspicious for contralateral disease, as above.  RECOMMENDATION: 1. Recommend MR guided core needle biopsy of at least one of the described regions of non mass enhancement in the right breast. The most conspicuous targets for biopsy are in the upper midline breast and lower inner quadrant, though can be determined on day of biopsy. These findings may also be secondary to background parenchymal enhancement and if not reproducible on day of biopsy, six-month follow-up breast MR may be considered. 2. Ultrasound-guided core needle biopsy of an abnormal left axillary lymph node.  BI-RADS CATEGORY 4: Suspicious.   Electronically Signed By: Andrew Crane On: 02/26/2015 14:11  Other Problems (Sonya Bynum, CMA; 02/23/2015 10:55 AM) Anxiety Disorder Breast Cancer Depression Other disease, cancer, significant illness  Past Surgical History (Sonya Bynum, CMA; 02/23/2015 10:55 AM) Breast Biopsy Left. Oral Surgery  Diagnostic Studies History (Sonya Bynum, CMA; 02/23/2015 10:55 AM) Colonoscopy never Mammogram within last year Pap Smear 1-5 years ago  Allergies (Sonya Bynum, CMA; 02/23/2015 10:56 AM) No Known Drug Allergies10/07/2014  Medication History (Sonya Bynum, CMA; 02/23/2015 10:58 AM) ALPRAZolam (0.5MG Tablet, Oral) Active. Gabapentin (300MG Capsule, Oral) Active. DULoxetine HCl (30MG Capsule DR Part, Oral) Active. Amphetamine-Dextroamphetamine (20MG Tablet, Oral) Active. Flexeril (10MG Tablet, Oral) Active. Wellbutrin SR (150MG Tablet ER 12HR, Oral)  Active. Adderall XR (20MG Capsule ER 24HR, Oral) Active. Xanax (0.5MG Tablet, Oral) Active. Allegra (180MG Tablet, Oral) Active. Flonase (50MCG/ACT Suspension, Nasal as needed) Active. Advil (100MG Tablet Chewable, Oral) Active. Multivitamin Adult (Oral) Active. Medications Reconciled  Social History (Sonya Bynum, CMA; 02/23/2015 10:55 AM) Alcohol use Occasional alcohol use. Caffeine use Carbonated beverages, Coffee, Tea. Illicit drug use Remotely quit drug use. Tobacco use Former smoker.  Family History (Sonya Bynum, CMA; 02/23/2015 10:55 AM) Alcohol Abuse Father. Arthritis Father. Bleeding disorder Sister. Depression Father, Son. Hypertension Father. Migraine Headache Father, Mother, Son. Respiratory Condition Father.  Pregnancy / Birth History (Sonya Bynum, CMA; 02/23/2015 10:55 AM) Age at menarche 14 years. Contraceptive History Depo-provera. Gravida 2 Maternal age 15-20 Para 2 Regular periods    Review of Systems (Sonya Bynum CMA; 02/23/2015 10:55 AM) General Present- Weight Gain. Not Present- Appetite Loss, Chills, Fatigue, Fever, Night Sweats and Weight Loss. Skin Not Present- Change in Wart/Mole, Dryness, Hives, Jaundice, New Lesions, Non-Healing Wounds, Rash and Ulcer. HEENT Present- Seasonal Allergies. Not Present- Earache, Hearing Loss, Hoarseness, Nose Bleed, Oral Ulcers, Ringing in the Ears, Sinus Pain, Sore Throat, Visual Disturbances, Wears glasses/contact lenses and Yellow Eyes. Respiratory Not Present- Bloody sputum, Chronic Cough, Difficulty Breathing, Snoring and Wheezing. Breast Present- Breast Mass, Breast Pain, Nipple Discharge and Skin Changes. Cardiovascular Not Present- Chest Pain, Difficulty Breathing Lying Down, Leg Cramps, Palpitations, Rapid Heart Rate, Shortness of Breath and Swelling of Extremities. Gastrointestinal Not Present- Abdominal Pain, Bloating, Bloody Stool, Change in Bowel Habits, Chronic diarrhea, Constipation,  Difficulty Swallowing, Excessive gas, Gets full quickly at meals, Hemorrhoids, Indigestion, Nausea, Rectal Pain and Vomiting. Female Genitourinary Not Present- Frequency, Nocturia, Painful Urination, Pelvic Pain and Urgency. Musculoskeletal Present- Joint Pain. Not Present- Back Pain, Joint Stiffness,   Muscle Pain, Muscle Weakness and Swelling of Extremities. Neurological Not Present- Decreased Memory, Fainting, Headaches, Numbness, Seizures, Tingling, Tremor, Trouble walking and Weakness. Psychiatric Present- Anxiety. Not Present- Bipolar, Change in Sleep Pattern, Depression, Fearful and Frequent crying. Endocrine Not Present- Cold Intolerance, Excessive Hunger, Hair Changes, Heat Intolerance, Hot flashes and New Diabetes. Hematology Not Present- Easy Bruising, Excessive bleeding, Gland problems, HIV and Persistent Infections.  Vitals (Sonya Bynum CMA; 02/23/2015 10:56 AM) 02/23/2015 10:55 AM Weight: 161 lb Height: 61in Body Surface Area: 1.77 m Body Mass Index: 30.42 kg/m  Temp.: 98F(Temporal)  Pulse: 79 (Regular)  BP: 124/86 (Sitting, Left Arm, Standard)     Physical Exam (Margy Sumler K. Zyren Sevigny MD; 02/23/2015 12:25 PM) The physical exam findings are as follows: Note:WDWN in NAD HEENT: EOMI, sclera anicteric Neck: No masses, no thyromegaly Lungs: CTA bilaterally; normal respiratory effort Breasts: Right - upper half of breast is quite firm, no dominant masses; no axillary lymphadenopathy; no nipple discharge Left - upper half of breast is quite firm, no lymphadenopathy; no nipple discharge; at 10:00 behind the areola, there is a 1 cm mass that seems to stand out from the surrounding firm breast tissue CV: Regular rate and rhythm; no murmurs Abd: +bowel sounds, soft, non-tender, no masses Ext: Well-perfused; no edema Skin: Warm, dry; no sign of jaundice    Assessment & Plan (Montez Stryker K. Barabara Motz MD; 02/23/2015 12:29 PM) DCIS (DUCTAL CARCINOMA IN SITU), LEFT (D05.12) Current  Plans  MRI, BOTH BREASTS ([id number]) Referred to Oncology, for evaluation and follow up (Oncology). Note:I spent a considerable amount of time with the patient and her husband as well as her son discussing their situation. Currently the core biopsies show only noninvasive DCIS. However I explained the possibility of sampling error. We discussed the need to determine the extent of involvement of not only the left breast but to determine whether there was any suspicious findings in the right breast. An MRI will assist us with this evaluation. There is also a question of the mildly enlarged lymph node in the left axilla. This may need to be biopsied under ultrasound guidance as well. She understands that there is a risk of invasive cancer within the large area of involvement on the left breast. We discussed the importance of information from the lymph nodes and discussed sentinel lymph node biopsy. We also discussed surgical options. Since it appears that she has such a large area of her left breast involved, she would likely need to have a mastectomy with or without immediate reconstruction. She seems to be interested in bilateral mastectomies with immediate reconstruction.  We discussed the fact that her prognostic panel is not yet complete. She has no family history of breast cancer but at her age with one sister, she would likely benefit from genetic counseling. We will go ahead and place this referral as well as an oncology referral. MRI has been ordered. If the patient needs to undergo mastectomy with reconstruction we will refer her to the appropriate plastic surgeon.  Total counseling time 45 minutes   Impression:  High grade DCIS left breast  Plan:  After thorough consultation and genetic testing, the patient has decided to pursue left mastectomy with sentinel lymph node biopsy and prophylactic right mastectomy, with immediate reconstruction by Dr. Bowers.  The surgical procedure has  been discussed with the patient.  Potential risks, benefits, alternative treatments, and expected outcomes have been explained.  All of the patient's questions at this time have been answered.  The likelihood of reaching   the patient's treatment goal is good.  The patient understand the proposed surgical procedure and wishes to proceed.  Sherica Paternostro K. Lion Fernandez, MD, FACS [facility]  General/ Trauma Surgery  03/18/2015 3:32 PM   

## 2015-04-09 NOTE — Anesthesia Preprocedure Evaluation (Addendum)
Anesthesia Evaluation  Patient identified by MRN, date of birth, ID band Patient awake    Reviewed: Allergy & Precautions, NPO status , Patient's Chart, lab work & pertinent test results  Airway Mallampati: III  TM Distance: >3 FB Neck ROM: Full    Dental  (+) Teeth Intact, Dental Advisory Given, Edentulous Upper, Edentulous Lower, Upper Dentures   Pulmonary former smoker,    breath sounds clear to auscultation       Cardiovascular negative cardio ROS   Rhythm:Regular Rate:Normal     Neuro/Psych Anxiety negative neurological ROS     GI/Hepatic Neg liver ROS, GERD  ,  Endo/Other  negative endocrine ROS  Renal/GU negative Renal ROS     Musculoskeletal  (+) Arthritis , Fibromyalgia -  Abdominal   Peds  Hematology negative hematology ROS (+)   Anesthesia Other Findings   Reproductive/Obstetrics                          Lab Results  Component Value Date   WBC 7.9 04/07/2015   HGB 14.2 04/07/2015   HCT 41.9 04/07/2015   MCV 89.1 04/07/2015   PLT 274 04/07/2015   Lab Results  Component Value Date   CREATININE 0.75 04/07/2015   BUN 8 04/07/2015   NA 135 04/07/2015   K 4.0 04/07/2015   CL 104 04/07/2015   CO2 26 04/07/2015   No results found for: INR, PROTIME  Anesthesia Physical Anesthesia Plan  ASA: II  Anesthesia Plan: General and Regional   Post-op Pain Management: GA combined w/ Regional for post-op pain   Induction: Intravenous  Airway Management Planned: Oral ETT  Additional Equipment:   Intra-op Plan:   Post-operative Plan: Extubation in OR  Informed Consent: I have reviewed the patients History and Physical, chart, labs and discussed the procedure including the risks, benefits and alternatives for the proposed anesthesia with the patient or authorized representative who has indicated his/her understanding and acceptance.   Dental advisory given  Plan Discussed  with: CRNA  Anesthesia Plan Comments:         Anesthesia Quick Evaluation

## 2015-04-09 NOTE — Op Note (Deleted)
[name], JEWKES NO.:  [id number]  MEDICAL RECORD NO.:  [id number]  LOCATION:  [facility]                          FACILITY:  [facility]  PHYSICIAN:  Crissie Reese, M.D.     DATE OF BIRTH:  1975-06-27  DATE OF PROCEDURE:  04/09/2015 DATE OF DISCHARGE:                              OPERATIVE REPORT   PREOPERATIVE DIAGNOSIS:  Breast cancer.  POSTOPERATIVE DIAGNOSIS:  Breast cancer.  PROCEDURE PERFORMED: 1. Left breast reconstruction with tissue expander. 2. Distinct procedure, reconstruction of left chest wall with 120 cm sq     of acellular dermal matrix for inadequate muscle and reset of     inframammary fold. 3. Right breast reconstruction with tissue expander. 4. Distinct procedure, reconstruction of chest wall with acellular     dermal matrix 110 cm sq for inadequate muscle and reset of inframammary fold.  SURGEON:  Crissie Reese, M.D.  ANESTHESIA:  General.  ESTIMATED BLOOD LOSS:  25 mL.  DRAINS:  Two 19-French drains on each side.  CLINICAL NOTE:  [name] has breast cancer, who is having bilateral mastectomy.  She desired reconstruction.  Options were discussed and she elected to have bilateral mastectomy with placement of tissue expanders.  The nature of procedure and risks, possible complications were discussed with her.  She understood the nature of the procedure as well as the risks that included, but not limited to, bleeding, infection, healing problems, scarring, loss of sensation, fluid accumulations, anesthesia-related complications, pneumothorax, DVT, PE, contour deformities, contour deformities of the periphery of reconstruction, failure of device, capsular contracture, displacement of device, wrinkles and ripples, asymmetry, chronic pain, and overall disappointment.  She understood that there was the possibility for the need for an acellular dermal matrix for adequate deep coverage of the tissue expanders.  She understood the staged  nature of these procedures and wished to proceed.  DESCRIPTION OF PROCEDURE:  The patient was in the operating room and the bilateral mastectomy completed.  The skin flaps appeared to be healthy. No evidence of any vascular compromise of the skin.  Irrigation with saline.  Muscles were elevated.  Muscles were inadequate for coverage of the tissue expanders.  Acellular dermal matrix was soaked in saline for greater than 10 minutes as well as antibiotic solution and was pie- crusted.  After thorough soaking and stripping the acellular dermal matrix of all fluid that was placed back in the soak.  After thoroughly cleaning gloves, tissue expanders were prepared.  The chest wall base of the wound measured approximately 14 cm and the 600 mL ARTOURA high- profile tissue expander was prepared.  The air was removed, 150 mL sterile saline placed using the closed filling system and expander was continued to soak in antibiotic solution.  The spaces were checked. Excellent hemostasis was confirmed.  Again after thoroughly cleaning gloves, expanders were positioned.  The care was taken to make sure they were oriented properly.  The acellular dermal matrix was then placed and care was taken to make sure that it was oriented with the dermal side facing upwards towards the mastectomy flaps.  It was secured around the periphery using 3-0 PDS sutures with great care taken to  avoid damage to underlying tissue expanders, which were kept under direct vision at all times.  Two 19-French drains were positioned on each side; one medial and one lateral and brought through separate stab wounds and secured with 3-0 Prolene sutures.  Tunnels were made subcutaneously greater than 5 cm for each drain.  The mastectomy flaps were then irrigated with saline and excellent hemostasis was confirmed.  The closures with 3-0 Monocryl interrupted inverted deep dermal sutures.  The drains were dressed with BioPatch and SorbaView  dressings and the skin closures were sealed with Dermabond.  Dry sterile dressings and the chest binder positioned and she was transported to the recovery room stable having tolerated the procedure well.     Crissie Reese, M.D.     DB/MEDQ  D:  04/09/2015  T:  04/09/2015  Job:  [id number]

## 2015-04-09 NOTE — Telephone Encounter (Signed)
lvm for pt regarding to DE[facility] appt.Marland KitchenMarland KitchenMarland Kitchen

## 2015-04-09 NOTE — Anesthesia Procedure Notes (Addendum)
Procedure Name: Intubation Date/Time: 04/09/2015 12:01 PM Performed by: Merdis Delay Pre-anesthesia Checklist: Patient identified, Timeout performed, Emergency Drugs available, Suction available and Patient being monitored Patient Re-evaluated:Patient Re-evaluated prior to inductionOxygen Delivery Method: Circle system utilized Preoxygenation: Pre-oxygenation with 100% oxygen Intubation Type: IV induction Ventilation: Mask ventilation without difficulty and Oral airway inserted - appropriate to patient size Laryngoscope Size: Mac and 3 Grade View: Grade I Tube type: Oral Tube size: 7.0 mm Number of attempts: 1 Airway Equipment and Method: Stylet Placement Confirmation: breath sounds checked- equal and bilateral,  positive ETCO2,  CO2 detector and ETT inserted through vocal cords under direct vision Secured at: 22 cm Tube secured with: Tape Dental Injury: Teeth and Oropharynx as per pre-operative assessment    Anesthesia Regional Block:  Pectoralis block  Pre-Anesthetic Checklist: ,, timeout performed, Correct Patient, Correct Site, Correct Laterality, Correct Procedure, Correct Position, site marked, Risks and benefits discussed,  Surgical consent,  Pre-op evaluation,  At surgeon's request and post-op pain management  Laterality: Left and Right  Prep: chloraprep       Needles:  Injection technique: Single-shot  Needle Type: Echogenic Needle     Needle Length: 9cm 9 cm Needle Gauge: 21 and 21 G    Additional Needles:  Procedures: ultrasound guided (picture in chart) Pectoralis block Narrative:  Start time: 04/09/2015 11:10 AM End time: 04/09/2015 11:20 AM Injection made incrementally with aspirations every 5 mL.  Performed by: Personally  Anesthesiologist: Suzette Battiest  Additional Notes: Bilateral pectoralis blocks performed. Risks and benefits discussed. Pt tolerated well with no immediate complications.

## 2015-04-10 ENCOUNTER — Encounter (HOSPITAL_COMMUNITY): Payer: Self-pay | Admitting: Surgery

## 2015-04-10 LAB — BASIC METABOLIC PANEL
ANION GAP: 5 (ref 5–15)
BUN: 7 mg/dL (ref 6–20)
CALCIUM: 8.2 mg/dL — AB (ref 8.9–10.3)
CO2: 26 mmol/L (ref 22–32)
CREATININE: 0.82 mg/dL (ref 0.44–1.00)
Chloride: 105 mmol/L (ref 101–111)
GLUCOSE: 113 mg/dL — AB (ref 65–99)
POTASSIUM: 4 mmol/L (ref 3.5–5.1)
Sodium: 136 mmol/L (ref 135–145)

## 2015-04-10 LAB — CBC
HEMATOCRIT: 31 % — AB (ref 36.0–46.0)
HEMOGLOBIN: 10.5 g/dL — AB (ref 12.0–15.0)
MCH: 30.5 pg (ref 26.0–34.0)
MCHC: 33.9 g/dL (ref 30.0–36.0)
MCV: 90.1 fL (ref 78.0–100.0)
Platelets: 223 10*3/uL (ref 150–400)
RBC: 3.44 MIL/uL — ABNORMAL LOW (ref 3.87–5.11)
RDW: 12.5 % (ref 11.5–15.5)
WBC: 12.8 10*3/uL — AB (ref 4.0–10.5)

## 2015-04-10 MED ORDER — HYDROMORPHONE HCL 2 MG PO TABS
2.0000 mg | ORAL_TABLET | ORAL | Status: DC | PRN
  Administered 2015-04-10 – 2015-04-11 (×6): 4 mg via ORAL
  Filled 2015-04-10 (×6): qty 2

## 2015-04-10 MED ORDER — DOXYCYCLINE HYCLATE 100 MG PO TABS
100.0000 mg | ORAL_TABLET | Freq: Two times a day (BID) | ORAL | Status: DC
Start: 2015-04-10 — End: 2015-04-11
  Administered 2015-04-10 – 2015-04-11 (×3): 100 mg via ORAL
  Filled 2015-04-10 (×3): qty 1

## 2015-04-10 MED ORDER — PROMETHAZINE HCL 25 MG PO TABS: 25.0000 mg | ORAL_TABLET | Freq: Four times a day (QID) | ORAL | Status: DC | PRN

## 2015-04-10 MED ORDER — METHOCARBAMOL 500 MG PO TABS
500.0000 mg | ORAL_TABLET | Freq: Four times a day (QID) | ORAL | Status: DC
  Administered 2015-04-10 – 2015-04-11 (×5): 500 mg via ORAL
  Filled 2015-04-10 (×5): qty 1

## 2015-04-10 NOTE — Progress Notes (Signed)
Subjective: Sore. Good pain control with dilaudid.  Objective: Vital signs in last 24 hours: Temp:  [97.3 F (36.3 C)-100 F (37.8 C)] 100 F (37.8 C) (11/18 0500) Pulse Rate:  [67-127] 99 (11/18 0500) Resp:  [10-22] 19 (11/18 0500) BP: (106-157)/(63-94) 106/68 mmHg (11/18 0500) SpO2:  [97 %-100 %] 100 % (11/18 0500) Weight:  [163 lb (73.936 kg)] 163 lb (73.936 kg) (11/17 0949)  Intake/Output from previous day: 11/17 0701 - 11/18 0700 In: 3693.3 [P.O.:60; I.V.:3283.3; IV Piggyback:350] Out: 2017 [Urine:1200; Drains:367; Blood:450] Intake/Output this shift:    Operative sites: Mastectomy flaps viable. Tissue expanders appear to be in good position. Drains functioning. Drainage thin. No evidence of bleeding or infection either side.    Recent Labs  04/07/15 1504 04/09/15 2046 04/10/15 0704  WBC 7.9 14.9* 12.8*  HGB 14.2 11.4* 10.5*  HCT 41.9 33.6* 31.0*  NA 135  --   --   K 4.0  --   --   CL 104  --   --   CO2 26  --   --   BUN 8  --   --   CREATININE 0.75 0.83  --     Studies/Results: Nm Sentinel Node Inj-no Rpt (breast)  04/09/2015  CLINICAL DATA: left breast cancer Sulfur colloid was injected intradermally by the nuclear medicine technologist for breast cancer sentinel node localization.    Assessment/Plan: Ambulate. PO pain meds.   LOS: 1 day    Crissie Reese M 04/10/2015 8:13 AM

## 2015-04-10 NOTE — Progress Notes (Signed)
1 Day Post-Op  Subjective: Patient is very sore across her chest "feels like a horse kicked me in the chest" Patient just examined by Dr. Harlow Mares, who reports that the skin flaps are viable and drains are functioning. No signs of bleeding or infection  Objective: Vital signs in last 24 hours: Temp:  [97.3 F (36.3 C)-100 F (37.8 C)] 100 F (37.8 C) (11/18 0500) Pulse Rate:  [67-127] 99 (11/18 0500) Resp:  [10-22] 19 (11/18 0500) BP: (106-157)/(63-94) 106/68 mmHg (11/18 0500) SpO2:  [97 %-100 %] 100 % (11/18 0500) Weight:  [73.936 kg (163 lb)] 73.936 kg (163 lb) (11/17 0949) Last BM Date: 04/08/15  Intake/Output from previous day: 11/17 0701 - 11/18 0700 In: 3693.3 [P.O.:60; I.V.:3283.3; IV Piggyback:350] Out: 2017 [Urine:1200; Drains:367; Blood:450] Intake/Output this shift:    General appearance: alert, cooperative and no distress Resp: clear to auscultation bilaterally Chest wall: no tenderness, bilateral tenderness; dressing in place per Dr. Harlow Mares Cardio: regular rate and rhythm, S1, S2 normal, no murmur, click, rub or gallop  Lab Results:   Recent Labs  04/09/15 2046 04/10/15 0704  WBC 14.9* 12.8*  HGB 11.4* 10.5*  HCT 33.6* 31.0*  PLT 227 223   BMET  Recent Labs  04/07/15 1504 04/09/15 2046 04/10/15 0704  NA 135  --  136  K 4.0  --  4.0  CL 104  --  105  CO2 26  --  26  GLUCOSE 103*  --  113*  BUN 8  --  7  CREATININE 0.75 0.83 0.82  CALCIUM 9.4  --  8.2*   PT/INR No results for input(s): LABPROT, INR in the last 72 hours. ABG No results for input(s): PHART, HCO3 in the last 72 hours.  Invalid input(s): PCO2, PO2  Studies/Results: Nm Sentinel Node Inj-no Rpt (breast)  04/09/2015  CLINICAL DATA: left breast cancer Sulfur colloid was injected intradermally by the nuclear medicine technologist for breast cancer sentinel node localization.    Anti-infectives: Anti-infectives    Start     Dose/Rate Route Frequency Ordered Stop   04/10/15 1000   doxycycline (VIBRA-TABS) tablet 100 mg     100 mg Oral Every 12 hours 04/10/15 0813     04/10/15 0000  ceFAZolin (ANCEF) IVPB 2 g/50 mL premix  Status:  Discontinued     2 g 100 mL/hr over 30 Minutes Intravenous 4 times per day 04/09/15 1951 04/10/15 0813   04/09/15 2200  ceFAZolin (ANCEF) IVPB 2 g/50 mL premix     2 g 100 mL/hr over 30 Minutes Intravenous 3 times per day 04/09/15 1944 04/09/15 2215   04/09/15 2200  ceFAZolin (ANCEF) IVPB 2 g/50 mL premix  Status:  Discontinued     2 g 100 mL/hr over 30 Minutes Intravenous 4 times per day 04/09/15 1944 04/09/15 1951   04/09/15 1429  bacitracin 50,000 Units, gentamicin (GARAMYCIN) 80 mg, ceFAZolin (ANCEF) 1 g in sodium chloride 0.9 % 1,000 mL  Status:  Discontinued       As needed 04/09/15 1430 04/09/15 1649   04/09/15 1045  ceFAZolin (ANCEF) IVPB 2 g/50 mL premix     2 g 100 mL/hr over 30 Minutes Intravenous To ShortStay Surgical 04/08/15 1311 04/09/15 1620   04/09/15 1030  bacitracin 50,000 Units, gentamicin (GARAMYCIN) 80 mg, ceFAZolin (ANCEF) 1 g in sodium chloride 0.9 % 1,000 mL  Status:  Discontinued      Irrigation Every 15 min 04/09/15 1027 04/09/15 1944      Assessment/Plan: s/p Procedure(s):  LEFT MASTECTOMY WITH SENTINEL LEFT LYMPH NODE BIOPSY AND RIGHT PROPHYLACTIC MASTECTOMY (Bilateral) PLACEMENT OF BILATERAL TISSUE EXPANDER AND  USE OF ADM  (ACELLULAR DERMAL MATRIX) (Bilateral) Advance diet Plan for discharge tomorrow Attempt to use only PO pain meds  Encourage ambulation  LOS: 1 day    [name] K. 04/10/2015

## 2015-04-10 NOTE — Care Management Note (Signed)
Case Management Note  Patient Details  Name: [name] MRN: [id number] Date of Birth: 07/31/1975  Subjective/Objective:                    Action/Plan:   Expected Discharge Date:                  Expected Discharge Plan:  Home/Self Care  In-House Referral:     Discharge planning Services     Post Acute Care Choice:    Choice offered to:     DME Arranged:    DME Agency:     HH Arranged:    [place] Agency:     Status of Service:  In process, will continue to follow  Medicare Important Message Given:    Date Medicare IM Given:    Medicare IM give by:    Date Additional Medicare IM Given:    Additional Medicare Important Message give by:     If discussed at [place] of Stay Meetings, dates discussed:    Additional Comments: Initial UR completed  Marilu Favre, RN 04/10/2015, 8:33 AM

## 2015-04-11 LAB — BASIC METABOLIC PANEL
Anion gap: 6 (ref 5–15)
BUN: <5 mg/dL — ABNORMAL LOW (ref 6–20)
CALCIUM: 8.1 mg/dL — AB (ref 8.9–10.3)
CO2: 26 mmol/L (ref 22–32)
CREATININE: 0.68 mg/dL (ref 0.44–1.00)
Chloride: 101 mmol/L (ref 101–111)
Glucose, Bld: 122 mg/dL — ABNORMAL HIGH (ref 65–99)
Potassium: 3.7 mmol/L (ref 3.5–5.1)
SODIUM: 133 mmol/L — AB (ref 135–145)

## 2015-04-11 LAB — CBC
HCT: 34 % — ABNORMAL LOW (ref 36.0–46.0)
Hemoglobin: 11.2 g/dL — ABNORMAL LOW (ref 12.0–15.0)
MCH: 30 pg (ref 26.0–34.0)
MCHC: 32.9 g/dL (ref 30.0–36.0)
MCV: 91.2 fL (ref 78.0–100.0)
PLATELETS: 216 10*3/uL (ref 150–400)
RBC: 3.73 MIL/uL — ABNORMAL LOW (ref 3.87–5.11)
RDW: 12.5 % (ref 11.5–15.5)
WBC: 13.1 10*3/uL — ABNORMAL HIGH (ref 4.0–10.5)

## 2015-04-11 MED ORDER — HYDROMORPHONE HCL 2 MG PO TABS: 2.0000 mg | ORAL_TABLET | ORAL | Status: DC | PRN

## 2015-04-11 MED ORDER — METHOCARBAMOL 500 MG PO TABS: 500.0000 mg | ORAL_TABLET | Freq: Four times a day (QID) | ORAL | Status: DC

## 2015-04-11 MED ORDER — ENOXAPARIN SODIUM 40 MG/0.4ML ~~LOC~~ SOLN: 40.0000 mg | SUBCUTANEOUS | Status: DC

## 2015-04-11 MED ORDER — DOCUSATE SODIUM 100 MG PO CAPS: 100.0000 mg | ORAL_CAPSULE | Freq: Two times a day (BID) | ORAL | Status: DC

## 2015-04-11 MED ORDER — DOXYCYCLINE HYCLATE 100 MG PO TABS: 100.0000 mg | ORAL_TABLET | Freq: Two times a day (BID) | ORAL | Status: AC

## 2015-04-11 NOTE — Progress Notes (Signed)
Patient ID: [name], female   DOB: [birth year], 39 y.o.   MRN: [id number] 2 Days Post-Op  Subjective: Feels better today. Just sore but no major complaints.  Objective: Vital signs in last 24 hours: Temp:  [99 F (37.2 C)-100 F (37.8 C)] 99 F (37.2 C) (11/19 0517) Pulse Rate:  [96-104] 104 (11/19 0517) Resp:  [18-19] 19 (11/19 0517) BP: (103-126)/(59-68) 103/59 mmHg (11/19 0517) SpO2:  [100 %] 100 % (11/19 0517) Last BM Date: 04/08/15  Intake/Output from previous day: 11/18 0701 - 11/19 0700 In: 1440 [P.O.:1440] Out: [id number] [Urine:1350; Drains:405] Intake/Output this shift:    General appearance: alert, cooperative and no distress Incision/Wound: skin flaps and wounds appear healthy. Drainage is clearing.  Lab Results:   Recent Labs  04/10/15 0704 04/11/15 0404  WBC 12.8* 13.1*  HGB 10.5* 11.2*  HCT 31.0* 34.0*  PLT 223 216   BMET  Recent Labs  04/10/15 0704 04/11/15 0404  NA 136 133*  K 4.0 3.7  CL 105 101  CO2 26 26  GLUCOSE 113* 122*  BUN 7 <5*  CREATININE 0.82 0.68  CALCIUM 8.2* 8.1*     Studies/Results: Nm Sentinel Node Inj-no Rpt (breast)  04/09/2015  CLINICAL DATA: left breast cancer Sulfur colloid was injected intradermally by the nuclear medicine technologist for breast cancer sentinel node localization.    Anti-infectives: Anti-infectives    Start     Dose/Rate Route Frequency Ordered Stop   04/10/15 1000  doxycycline (VIBRA-TABS) tablet 100 mg     100 mg Oral Every 12 hours 04/10/15 0813     04/10/15 0000  ceFAZolin (ANCEF) IVPB 2 g/50 mL premix  Status:  Discontinued     2 g 100 mL/hr over 30 Minutes Intravenous 4 times per day 04/09/15 1951 04/10/15 0813   04/09/15 2200  ceFAZolin (ANCEF) IVPB 2 g/50 mL premix     2 g 100 mL/hr over 30 Minutes Intravenous 3 times per day 04/09/15 1944 04/09/15 2215   04/09/15 2200  ceFAZolin (ANCEF) IVPB 2 g/50 mL premix  Status:  Discontinued     2 g 100 mL/hr over 30 Minutes Intravenous 4  times per day 04/09/15 1944 04/09/15 1951   04/09/15 1429  bacitracin 50,000 Units, gentamicin (GARAMYCIN) 80 mg, ceFAZolin (ANCEF) 1 g in sodium chloride 0.9 % 1,000 mL  Status:  Discontinued       As needed 04/09/15 1430 04/09/15 1649   04/09/15 1045  ceFAZolin (ANCEF) IVPB 2 g/50 mL premix     2 g 100 mL/hr over 30 Minutes Intravenous To ShortStay Surgical 04/08/15 1311 04/09/15 1620   04/09/15 1030  bacitracin 50,000 Units, gentamicin (GARAMYCIN) 80 mg, ceFAZolin (ANCEF) 1 g in sodium chloride 0.9 % 1,000 mL  Status:  Discontinued      Irrigation Every 15 min 04/09/15 1027 04/09/15 1944      Assessment/Plan: s/p Procedure(s): LEFT MASTECTOMY WITH SENTINEL LEFT LYMPH NODE BIOPSY AND RIGHT PROPHYLACTIC MASTECTOMY PLACEMENT OF BILATERAL TISSUE EXPANDER AND  USE OF ADM  (ACELLULAR DERMAL MATRIX) Doing well without apparent complication. Likely home today, per Dr. Harlow Mares.   LOS: 2 days    Bryndle Corredor T 04/11/2015

## 2015-04-11 NOTE — Discharge Instructions (Addendum)
No lifting for 6 weeks No vigorous activity for 6 weeks (including outdoor walks) No driving for 4 weeks OK to walk up stairs slowly Stay propped up Use incentive spirometer at home every hour while awake No shower while drains are in place Empty drains at least three times a day and record the amounts separately. Squeeze bulbs down firmly. Change drain dressings every third day if instructed to do so by Dr. Harlow Mares (There is no need to do this until after you return to Dr. Harlow Mares at his office  Apply Bacitracin antibiotic ointment to the drain sites  Place gauze dressing over drains  Secure the gauze with tape Take an over-the-counter Probiotic while on antibiotics Take an over-the-counter stool softener (such as Colace) while on pain medication See Dr. Harlow Mares this coming week For questions call [phone] or [phone]

## 2015-04-11 NOTE — Discharge Summary (Signed)
Physician Discharge Summary  Patient ID: [name] MRN: [id number] DOB/AGE: 1976-01-29 39 y.o.  Admit date: 04/09/2015 Discharge date: 04/11/2015  Admission Diagnoses: left breast cancer  Discharge Diagnoses: Same Active Problems:   Breast cancer in female [facility])   DCIS (ductal carcinoma in situ) of breast   Discharged Condition: good  Hospital Course: On the day of admission the patient was taken to surgery and had bilateral mastectomy, left sentinel node, bilateral reconstruction with tissue expanders and acellular dermal matrix. The patient tolerated the procedures well. Postoperatively, the mastectomy flaps maintained excellent color and capillary refill. The patient was ambulatory and tolerating diet on the first postoperative day. DVT and antibiotic surgical prophylaxis was continued. She is ready for discharge.  Treatments: antibiotics: Ancef, anticoagulation: LMW heparin and surgery: bilateral mastectomy, left sentinel node, and reconstruction with tissue expanders and acellular dermal matrix.  Discharge Exam: Blood pressure 103/59, pulse 104, temperature 99 F (37.2 C), temperature source Oral, resp. rate 19, height 5\' 1"  (1.549 m), weight 163 lb (73.936 kg), last menstrual period 03/05/2015, SpO2 100 %.  Operative sites: Mastectomy flaps viable. One small area epidermalysis right inferior flap. Tissue expanders appear to be positioned well. Drains functioning. Drainage thin. No evidence of bleeding or infection either side.  Disposition: 01-Home or Self Care     Medication List    STOP taking these medications        HYDROcodone-acetaminophen 5-325 MG tablet  Commonly known as:  NORCO     SUPER ANTIOXIDANTS PROTECTOR PO      TAKE these medications        acetaminophen 500 MG tablet  Commonly known as:  TYLENOL  Take 1,000 mg by mouth every 6 (six) hours as needed for moderate pain or headache.     ALPRAZolam 0.5 MG tablet  Commonly known as:  XANAX  Take  0.75 mg by mouth at bedtime as needed for anxiety.     amphetamine-dextroamphetamine 20 MG tablet  Commonly known as:  ADDERALL  Take 20 mg by mouth 2 (two) times daily as needed.     docusate sodium 100 MG capsule  Commonly known as:  COLACE  Take 1 capsule (100 mg total) by mouth 2 (two) times daily.     doxycycline 100 MG tablet  Commonly known as:  VIBRA-TABS  Take 1 tablet (100 mg total) by mouth every 12 (twelve) hours.     enoxaparin 40 MG/0.4ML injection  Commonly known as:  LOVENOX  Inject 0.4 mLs (40 mg total) into the skin daily.     hydrocortisone cream 1 %  Apply 1 application topically as needed for itching.     HYDROmorphone 2 MG tablet  Commonly known as:  DILAUDID  Take 1-2 tablets (2-4 mg total) by mouth every 4 (four) hours as needed for moderate pain or severe pain.     methocarbamol 500 MG tablet  Commonly known as:  ROBAXIN  Take 1 tablet (500 mg total) by mouth 4 (four) times daily.           Follow-up Information    Follow up with TSUEI,MATTHEW K., MD. Schedule an appointment as soon as possible for a visit in 3 weeks.   Specialty:  General Surgery   Contact information:   [place] [address] [phone]       Signed: Macon Large 04/11/2015, 10:01 AM

## 2015-04-14 ENCOUNTER — Other Ambulatory Visit: Payer: Self-pay | Admitting: *Deleted

## 2015-04-14 DIAGNOSIS — C50412 Malignant neoplasm of upper-outer quadrant of left female breast: Secondary | ICD-10-CM

## 2015-04-21 ENCOUNTER — Encounter (HOSPITAL_COMMUNITY): Payer: Self-pay | Admitting: Surgery

## 2015-04-23 ENCOUNTER — Encounter: Payer: Self-pay | Admitting: Hematology and Oncology

## 2015-04-23 ENCOUNTER — Ambulatory Visit (HOSPITAL_BASED_OUTPATIENT_CLINIC_OR_DEPARTMENT_OTHER): Payer: BLUE CROSS/BLUE SHIELD | Admitting: Hematology and Oncology

## 2015-04-23 ENCOUNTER — Telehealth: Payer: Self-pay | Admitting: Hematology and Oncology

## 2015-04-23 VITALS — BP 129/76 | HR 105 | Temp 98.0°F | Resp 20 | Ht 61.0 in | Wt 163.6 lb

## 2015-04-23 DIAGNOSIS — C50412 Malignant neoplasm of upper-outer quadrant of left female breast: Secondary | ICD-10-CM

## 2015-04-23 DIAGNOSIS — D0512 Intraductal carcinoma in situ of left breast: Secondary | ICD-10-CM

## 2015-04-23 DIAGNOSIS — Z171 Estrogen receptor negative status [ER-]: Secondary | ICD-10-CM | POA: Diagnosis not present

## 2015-04-23 NOTE — Addendum Note (Signed)
Addended by: Prentiss Bells on: 04/23/2015 06:39 PM   Modules accepted: Medications

## 2015-04-23 NOTE — Assessment & Plan Note (Signed)
Bilateral mastectomies with immediate reconstruction 04/09/2015: left mastectomy: High-grade DCIS with necrosis and calcifications, 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 There is no evidence of invasive breast cancer in the final pathology. Visit is ER/PR negative, there is no role of antiestrogen therapy.  Recommendation: Surveillance with periodic chest exams and axillary exams in the survivorship clinic.

## 2015-04-23 NOTE — Progress Notes (Signed)
Patient Care Team: Cleone Slim. Rozetta Nunnery, MD as PCP - General (Internal Medicine)  DIAGNOSIS: No matching staging information was found for the patient.  SUMMARY OF ONCOLOGIC HISTORY:   Breast cancer of upper-outer quadrant of left female breast ([place])   02/18/2015 Mammogram Left breast calcs10 x 12.5 x 9.5 cm U/S 4.5 cm mass, prominent left axillary lymph node, dense breast. 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 She is still very sore from the recent surgery as well as the presence of drains.  REVIEW OF SYSTEMS:   Constitutional: Denies fevers, chills or abnormal weight loss Eyes: Denies blurriness of vision Ears, nose, mouth, throat, and face: Denies mucositis or sore throat Respiratory: Denies cough, dyspnea or wheezes Cardiovascular: Denies palpitation, chest discomfort or lower extremity swelling Gastrointestinal:  Denies nausea, heartburn or change in bowel habits Skin: Denies abnormal skin rashes Lymphatics: Denies new lymphadenopathy or easy bruising Neurological:Denies numbness, tingling or new weaknesses Behavioral/Psych: Mood is stable, no new changes  Breast: pain from recent surgeries and  reconstruction All other systems were reviewed with the patient and are negative.  I have reviewed the past medical history, past surgical history, social history and family history with the patient and they are unchanged from previous note.  ALLERGIES:  is allergic to bee pollen; erythromycin; nitrofurantoin monohyd macro; pyridium; shellfish allergy; and zithromax.  MEDICATIONS:  Current Outpatient Prescriptions  Medication Sig Dispense Refill  . acetaminophen (TYLENOL) 500 MG tablet Take 1,000 mg by mouth every 6 (six) hours as needed for moderate pain or headache.    . ALPRAZolam (XANAX) 0.5 MG tablet Take 0.75 mg by mouth at bedtime as needed for anxiety.     Marland Kitchen amphetamine-dextroamphetamine (ADDERALL) 20 MG tablet Take 20 mg by mouth 2 (two) times daily as needed.    . docusate sodium (COLACE) 100 MG capsule Take 1 capsule (100 mg total) by mouth 2 (two) times daily. 10 capsule 0  . doxycycline (VIBRA-TABS) 100 MG tablet Take 1 tablet (100 mg total) by mouth every 12 (twelve) hours. 30 tablet 0  . enoxaparin (LOVENOX) 40 MG/0.4ML injection Inject 0.4 mLs (40 mg total) into the skin daily. 12 Syringe 0  . hydrocortisone cream 1 % Apply 1 application topically as needed for itching.    Marland Kitchen HYDROmorphone (DILAUDID) 2 MG tablet Take 1-2 tablets (2-4 mg total) by mouth every 4 (four) hours as needed for moderate pain or severe pain. 40 tablet 0  . methocarbamol (ROBAXIN) 500 MG tablet Take 1 tablet (500 mg total) by mouth 4 (four) times daily. 40 tablet 1   No current facility-administered medications for this visit.    PHYSICAL EXAMINATION: ECOG PERFORMANCE STATUS: 2 - Symptomatic, <50% confined to bed  Filed Vitals:   04/23/15 1514  BP: 129/76  Pulse: 105  Temp: 98 F (36.7 C)  Resp: 20  Filed Weights   04/23/15 1514  Weight: 163 lb 9.6 oz (74.208 kg)    GENERAL:alert, no distress and comfortable SKIN: skin color, texture, turgor are normal, no rashes or significant  lesions EYES: normal, Conjunctiva are pink and non-injected, sclera clear OROPHARYNX:no exudate, no erythema and lips, buccal mucosa, and tongue normal  NECK: supple, thyroid normal size, non-tender, without nodularity LYMPH:  no palpable lymphadenopathy in the cervical, axillary or inguinal LUNGS: clear to auscultation and percussion with normal breathing effort HEART: regular rate & rhythm and no murmurs and no lower extremity edema ABDOMEN:abdomen soft, non-tender and normal bowel sounds Musculoskeletal:no cyanosis of digits and no clubbing  NEURO: alert & oriented x 3 with fluent speech, no focal motor/sensory deficits  LABORATORY DATA:  I have reviewed the data as listed   Chemistry      Component Value Date/Time   NA 133* 04/11/2015 0404   K 3.7 04/11/2015 0404   CL 101 04/11/2015 0404   CO2 26 04/11/2015 0404   BUN <5* 04/11/2015 0404   CREATININE 0.68 04/11/2015 0404      Component Value Date/Time   CALCIUM 8.1* 04/11/2015 0404       Lab Results  Component Value Date   WBC 13.1* 04/11/2015   HGB 11.2* 04/11/2015   HCT 34.0* 04/11/2015   MCV 91.2 04/11/2015   PLT 216 04/11/2015   NEUTROABS 4.3 08/30/2012    ASSESSMENT & PLAN:  Breast cancer of upper-outer quadrant of left female breast ([place]) Bilateral mastectomies with immediate reconstruction 04/09/2015: left mastectomy: High-grade DCIS with necrosis and calcifications, 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 There is no evidence of invasive breast cancer in the final pathology. Visit is ER/PR negative, there is no role of antiestrogen therapy. Since she had mastectomies, there is no role of adjuvant radiation.  Recommendation: Surveillance with periodic chest exams and axillary exams. Return to clinic in 1 year  No orders of the defined types were  placed in this encounter.   The patient has a good understanding of the overall plan. she agrees with it. she will call with any problems that may develop before the next visit here.   Rulon Eisenmenger, MD 04/23/2015

## 2015-04-23 NOTE — Telephone Encounter (Signed)
Gave and printed appt sched and avs for pt for Jan and [facility] 2017

## 2015-04-30 ENCOUNTER — Encounter (HOSPITAL_COMMUNITY): Payer: Self-pay | Admitting: Surgery

## 2015-06-05 ENCOUNTER — Telehealth: Payer: Self-pay | Admitting: Hematology and Oncology

## 2015-06-05 NOTE — Telephone Encounter (Signed)
Patient called in to cancel her survivorship appt,as she is on bed rest from surgery and had no chemo or rad/onc and req not to reschedule at this time

## 2015-06-08 ENCOUNTER — Other Ambulatory Visit (HOSPITAL_COMMUNITY): Payer: Self-pay | Admitting: Plastic Surgery

## 2015-06-09 ENCOUNTER — Encounter: Payer: Self-pay | Admitting: Nurse Practitioner

## 2015-06-09 ENCOUNTER — Encounter: Payer: BLUE CROSS/BLUE SHIELD | Admitting: Nurse Practitioner

## 2015-06-09 DIAGNOSIS — C50412 Malignant neoplasm of upper-outer quadrant of left female breast: Secondary | ICD-10-CM

## 2015-06-09 NOTE — Progress Notes (Signed)
The Survivorship Care Plan was mailed to [name] as she reported not being able to come in to the Survivorship Clinic for an in-person visit at this time. A letter was mailed to her outlining the purpose of the content of the care plan, as well as encouraging her to reach out to me with any questions or concerns.  My business card was included in the correspondence to the patient as well.  A copy of the care plan was also routed/faxed/mailed to Adline Mango, MD, the patient's PCP.  I will not be placing any follow-up appointments to the Survivorship Clinic for [name], but I am happy to see her at any time in the future for any survivorship concerns that may arise. Thank you for allowing me to participate in her care!  Kenn File, [place] [phone]

## 2015-06-10 ENCOUNTER — Encounter (HOSPITAL_COMMUNITY): Payer: Self-pay | Admitting: *Deleted

## 2015-06-10 NOTE — Progress Notes (Signed)
Pt denies SOB, chest pain, and being under the care of a cardiologist. Pt denies having a stress test, echo and cardiac cath. Pt denies having an EKG within the last month. Pt made aware to stop  taking Aspirin, otc vitamins, fish oil and herbal medications. Do not take any NSAIDs ie: Ibuprofen, Advil, Naproxen or any medication containing Aspirin. Pt verbalized understanding of all pre-op instructions.

## 2015-06-10 NOTE — Progress Notes (Signed)
Called pt and informed her of new surgery start time and instructed her to be here at 8:00 AM tomorrow. She voiced understanding.

## 2015-06-11 ENCOUNTER — Ambulatory Visit (HOSPITAL_COMMUNITY)
Admission: RE | Admit: 2015-06-11 | Discharge: 2015-06-12 | Disposition: A | Discharge status: Home / Self Care | Payer: BLUE CROSS/BLUE SHIELD | Source: Ambulatory Visit | Attending: Plastic Surgery | Admitting: Plastic Surgery

## 2015-06-11 ENCOUNTER — Ambulatory Visit (HOSPITAL_COMMUNITY): Payer: BLUE CROSS/BLUE SHIELD | Admitting: Certified Registered Nurse Anesthetist

## 2015-06-11 ENCOUNTER — Encounter (HOSPITAL_COMMUNITY): Payer: Self-pay | Admitting: Certified Registered Nurse Anesthetist

## 2015-06-11 ENCOUNTER — Encounter (HOSPITAL_COMMUNITY)
Admission: RE | Disposition: A | Discharge status: Home / Self Care | Payer: Self-pay | Source: Ambulatory Visit | Attending: Plastic Surgery

## 2015-06-11 DIAGNOSIS — Y834 Other reconstructive surgery as the cause of abnormal reaction of the patient, or of later complication, without mention of misadventure at the time of the procedure: Secondary | ICD-10-CM | POA: Diagnosis not present

## 2015-06-11 DIAGNOSIS — Z87891 Personal history of nicotine dependence: Secondary | ICD-10-CM | POA: Insufficient documentation

## 2015-06-11 DIAGNOSIS — Z6831 Body mass index (BMI) 31.0-31.9, adult: Secondary | ICD-10-CM | POA: Insufficient documentation

## 2015-06-11 DIAGNOSIS — Z853 Personal history of malignant neoplasm of breast: Secondary | ICD-10-CM | POA: Diagnosis not present

## 2015-06-11 DIAGNOSIS — M797 Fibromyalgia: Secondary | ICD-10-CM | POA: Insufficient documentation

## 2015-06-11 DIAGNOSIS — M1990 Unspecified osteoarthritis, unspecified site: Secondary | ICD-10-CM | POA: Insufficient documentation

## 2015-06-11 DIAGNOSIS — F419 Anxiety disorder, unspecified: Secondary | ICD-10-CM | POA: Diagnosis not present

## 2015-06-11 DIAGNOSIS — Z9013 Acquired absence of bilateral breasts and nipples: Secondary | ICD-10-CM | POA: Diagnosis not present

## 2015-06-11 DIAGNOSIS — Z421 Encounter for breast reconstruction following mastectomy: Secondary | ICD-10-CM | POA: Diagnosis present

## 2015-06-11 DIAGNOSIS — S21109A Unspecified open wound of unspecified front wall of thorax without penetration into thoracic cavity, initial encounter: Secondary | ICD-10-CM | POA: Diagnosis present

## 2015-06-11 DIAGNOSIS — T8189XA Other complications of procedures, not elsewhere classified, initial encounter: Secondary | ICD-10-CM | POA: Diagnosis not present

## 2015-06-11 HISTORY — PX: REMOVAL OF TISSUE EXPANDER AND PLACEMENT OF IMPLANT: SHX6457

## 2015-06-11 HISTORY — DX: Complete loss of teeth, unspecified cause, unspecified class: Z97.2

## 2015-06-11 HISTORY — DX: Complete loss of teeth, unspecified cause, unspecified class: K08.109

## 2015-06-11 HISTORY — PX: LATISSIMUS FLAP TO BREAST: SHX5357

## 2015-06-11 LAB — CBC
HCT: 40.7 % (ref 36.0–46.0)
Hemoglobin: 13.2 g/dL (ref 12.0–15.0)
MCH: 29.3 pg (ref 26.0–34.0)
MCHC: 32.4 g/dL (ref 30.0–36.0)
MCV: 90.4 fL (ref 78.0–100.0)
PLATELETS: 271 10*3/uL (ref 150–400)
RBC: 4.5 MIL/uL (ref 3.87–5.11)
RDW: 12.4 % (ref 11.5–15.5)
WBC: 7.2 10*3/uL (ref 4.0–10.5)

## 2015-06-11 LAB — BASIC METABOLIC PANEL
Anion gap: 9 (ref 5–15)
BUN: 13 mg/dL (ref 6–20)
CALCIUM: 9.2 mg/dL (ref 8.9–10.3)
CO2: 25 mmol/L (ref 22–32)
CREATININE: 0.69 mg/dL (ref 0.44–1.00)
Chloride: 105 mmol/L (ref 101–111)
GFR calc Af Amer: >60 mL/min (ref >60)
GFR calc non Af Amer: >60 mL/min (ref >60)
GLUCOSE: 98 mg/dL (ref 65–99)
Potassium: 3.9 mmol/L (ref 3.5–5.1)
Sodium: 139 mmol/L (ref 135–145)

## 2015-06-11 LAB — HCG, SERUM, QUALITATIVE: Preg, Serum: NEGATIVE

## 2015-06-11 SURGERY — REMOVAL, TISSUE EXPANDER, BREAST, WITH IMPLANT INSERTION
Anesthesia: General | Site: Chest | Laterality: Right

## 2015-06-11 MED ORDER — HEPARIN SODIUM (PORCINE) 5000 UNIT/ML IJ SOLN
5000.0000 [IU] | Freq: Once | INTRAMUSCULAR | Status: AC
  Administered 2015-06-11: 5000 [IU] via SUBCUTANEOUS

## 2015-06-11 MED ORDER — LIDOCAINE HCL (CARDIAC) 20 MG/ML IV SOLN
INTRAVENOUS | Status: DC | PRN
  Administered 2015-06-11: 20 mg via INTRAVENOUS

## 2015-06-11 MED ORDER — ONDANSETRON HCL 4 MG/2ML IJ SOLN
INTRAMUSCULAR | Status: AC
  Filled 2015-06-11: qty 2

## 2015-06-11 MED ORDER — ACETAMINOPHEN 500 MG PO TABS: 1000.0000 mg | ORAL_TABLET | Freq: Four times a day (QID) | ORAL | Status: DC | PRN

## 2015-06-11 MED ORDER — LACTATED RINGERS IV SOLN: INTRAVENOUS | Status: DC | PRN

## 2015-06-11 MED ORDER — MIDAZOLAM HCL 2 MG/2ML IJ SOLN
INTRAMUSCULAR | Status: AC
  Filled 2015-06-11: qty 2

## 2015-06-11 MED ORDER — 0.9 % SODIUM CHLORIDE (POUR BTL) OPTIME
TOPICAL | Status: DC | PRN
Start: 2015-06-11 — End: 2015-06-11
  Administered 2015-06-11 (×2): 1000 mL

## 2015-06-11 MED ORDER — CIPROFLOXACIN IN D5W 400 MG/200ML IV SOLN
400.0000 mg | INTRAVENOUS | Status: AC
  Administered 2015-06-11: 400 mg via INTRAVENOUS

## 2015-06-11 MED ORDER — PHENYLEPHRINE HCL 10 MG/ML IJ SOLN
10.0000 mg | INTRAVENOUS | Status: DC | PRN
  Administered 2015-06-11: 15 ug/min via INTRAVENOUS

## 2015-06-11 MED ORDER — ROCURONIUM BROMIDE 100 MG/10ML IV SOLN
INTRAVENOUS | Status: DC | PRN
  Administered 2015-06-11: 10 mg via INTRAVENOUS
  Administered 2015-06-11: 40 mg via INTRAVENOUS
  Administered 2015-06-11: 10 mg via INTRAVENOUS

## 2015-06-11 MED ORDER — CEFAZOLIN SODIUM-DEXTROSE 2-3 GM-% IV SOLR
2.0000 g | Freq: Three times a day (TID) | INTRAVENOUS | Status: DC
  Administered 2015-06-11 – 2015-06-12 (×2): 2 g via INTRAVENOUS
  Filled 2015-06-11 (×3): qty 50

## 2015-06-11 MED ORDER — HYDROMORPHONE HCL 1 MG/ML IJ SOLN: 0.2500 mg | INTRAMUSCULAR | Status: DC | PRN

## 2015-06-11 MED ORDER — ONDANSETRON HCL 4 MG/2ML IJ SOLN
INTRAMUSCULAR | Status: DC | PRN
  Administered 2015-06-11: 4 mg via INTRAVENOUS

## 2015-06-11 MED ORDER — METHOCARBAMOL 500 MG PO TABS
500.0000 mg | ORAL_TABLET | Freq: Four times a day (QID) | ORAL | Status: DC
  Administered 2015-06-11 – 2015-06-12 (×3): 500 mg via ORAL
  Filled 2015-06-11 (×3): qty 1

## 2015-06-11 MED ORDER — LACTATED RINGERS IV SOLN
INTRAVENOUS | Status: DC
  Administered 2015-06-11 (×2): via INTRAVENOUS

## 2015-06-11 MED ORDER — HEPARIN SODIUM (PORCINE) 5000 UNIT/ML IJ SOLN
INTRAMUSCULAR | Status: AC
  Administered 2015-06-11: 5000 [IU] via SUBCUTANEOUS
  Filled 2015-06-11: qty 1

## 2015-06-11 MED ORDER — CIPROFLOXACIN IN D5W 400 MG/200ML IV SOLN
INTRAVENOUS | Status: AC
  Filled 2015-06-11: qty 200

## 2015-06-11 MED ORDER — MEPERIDINE HCL 25 MG/ML IJ SOLN: 6.2500 mg | INTRAMUSCULAR | Status: DC | PRN

## 2015-06-11 MED ORDER — MIDAZOLAM HCL 5 MG/5ML IJ SOLN
INTRAMUSCULAR | Status: DC | PRN
  Administered 2015-06-11: 2 mg via INTRAVENOUS

## 2015-06-11 MED ORDER — FENTANYL CITRATE (PF) 250 MCG/5ML IJ SOLN
INTRAMUSCULAR | Status: AC
  Filled 2015-06-11: qty 5

## 2015-06-11 MED ORDER — DEXTROSE-NACL 5-0.45 % IV SOLN
INTRAVENOUS | Status: DC
  Administered 2015-06-11 – 2015-06-12 (×2): via INTRAVENOUS

## 2015-06-11 MED ORDER — LIDOCAINE HCL (CARDIAC) 20 MG/ML IV SOLN
INTRAVENOUS | Status: AC
  Filled 2015-06-11: qty 5

## 2015-06-11 MED ORDER — PROMETHAZINE HCL 25 MG/ML IJ SOLN: 6.2500 mg | INTRAMUSCULAR | Status: DC | PRN

## 2015-06-11 MED ORDER — NEOSTIGMINE METHYLSULFATE 10 MG/10ML IV SOLN
INTRAVENOUS | Status: DC | PRN
  Administered 2015-06-11: 4 mg via INTRAVENOUS

## 2015-06-11 MED ORDER — CEFAZOLIN SODIUM-DEXTROSE 2-3 GM-% IV SOLR
INTRAVENOUS | Status: AC
  Filled 2015-06-11: qty 50

## 2015-06-11 MED ORDER — ROCURONIUM BROMIDE 50 MG/5ML IV SOLN
INTRAVENOUS | Status: AC
  Filled 2015-06-11: qty 1

## 2015-06-11 MED ORDER — DOCUSATE SODIUM 100 MG PO CAPS
100.0000 mg | ORAL_CAPSULE | Freq: Two times a day (BID) | ORAL | Status: DC
  Administered 2015-06-11 – 2015-06-12 (×3): 100 mg via ORAL
  Filled 2015-06-11 (×3): qty 1

## 2015-06-11 MED ORDER — GLYCOPYRROLATE 0.2 MG/ML IJ SOLN
INTRAMUSCULAR | Status: DC | PRN
  Administered 2015-06-11: .6 mg via INTRAVENOUS

## 2015-06-11 MED ORDER — FENTANYL CITRATE (PF) 100 MCG/2ML IJ SOLN
INTRAMUSCULAR | Status: DC | PRN
  Administered 2015-06-11: 150 ug via INTRAVENOUS
  Administered 2015-06-11: 50 ug via INTRAVENOUS
  Administered 2015-06-11: 100 ug via INTRAVENOUS

## 2015-06-11 MED ORDER — SODIUM CHLORIDE 0.9 % IJ SOLN
INTRAMUSCULAR | Status: DC | PRN
  Administered 2015-06-11: 1000 mL

## 2015-06-11 MED ORDER — ALPRAZOLAM 0.5 MG PO TABS: 0.7500 mg | ORAL_TABLET | Freq: Every evening | ORAL | Status: DC | PRN

## 2015-06-11 MED ORDER — ENOXAPARIN SODIUM 40 MG/0.4ML ~~LOC~~ SOLN
40.0000 mg | SUBCUTANEOUS | Status: DC
  Administered 2015-06-12: 40 mg via SUBCUTANEOUS
  Filled 2015-06-11 (×2): qty 0.4

## 2015-06-11 MED ORDER — PROPOFOL 10 MG/ML IV BOLUS
INTRAVENOUS | Status: AC
  Filled 2015-06-11: qty 20

## 2015-06-11 MED ORDER — PROPOFOL 10 MG/ML IV BOLUS
INTRAVENOUS | Status: DC | PRN
  Administered 2015-06-11: 120 mg via INTRAVENOUS

## 2015-06-11 MED ORDER — BACIT-POLY-NEO HC 1 % EX OINT
TOPICAL_OINTMENT | CUTANEOUS | Status: AC
  Filled 2015-06-11: qty 15

## 2015-06-11 MED ORDER — SODIUM CHLORIDE 0.9 % IV SOLN
INTRAVENOUS | Status: DC | PRN
  Administered 2015-06-11: 1000 mL

## 2015-06-11 MED ORDER — CEFAZOLIN SODIUM-DEXTROSE 2-3 GM-% IV SOLR
2.0000 g | INTRAVENOUS | Status: AC
  Administered 2015-06-11: 2 g via INTRAVENOUS

## 2015-06-11 MED ORDER — PHENYLEPHRINE HCL 10 MG/ML IJ SOLN
INTRAMUSCULAR | Status: DC | PRN
  Administered 2015-06-11 (×3): 40 ug via INTRAVENOUS

## 2015-06-11 MED ORDER — DEXAMETHASONE SODIUM PHOSPHATE 4 MG/ML IJ SOLN
INTRAMUSCULAR | Status: DC | PRN
  Administered 2015-06-11: 4 mg via INTRAVENOUS

## 2015-06-11 MED ORDER — HYDROMORPHONE HCL 2 MG PO TABS
2.0000 mg | ORAL_TABLET | ORAL | Status: DC | PRN
Start: 2015-06-11 — End: 2015-06-12
  Administered 2015-06-11: 2 mg via ORAL
  Administered 2015-06-11 – 2015-06-12 (×3): 4 mg via ORAL
  Filled 2015-06-11 (×4): qty 2
  Filled 2015-06-11: qty 1
  Filled 2015-06-11: qty 2

## 2015-06-11 MED ORDER — SODIUM CHLORIDE 0.9 % IV SOLN
Freq: Once | INTRAVENOUS | Status: DC
  Filled 2015-06-11: qty 1

## 2015-06-11 MED ORDER — MIDAZOLAM HCL 2 MG/2ML IJ SOLN: 0.5000 mg | Freq: Once | INTRAMUSCULAR | Status: DC | PRN

## 2015-06-11 SURGICAL SUPPLY — 85 items
APPLIER CLIP 9.375 MED OPEN (MISCELLANEOUS) ×3
ATCH SMKEVC FLXB CAUT HNDSWH (FILTER) ×2 IMPLANT
BAG DECANTER FOR FLEXI CONT (MISCELLANEOUS) ×3 IMPLANT
BINDER BREAST LRG (GAUZE/BANDAGES/DRESSINGS) IMPLANT
BINDER BREAST XLRG (GAUZE/BANDAGES/DRESSINGS) IMPLANT
BINDER BREAST XXLRG (GAUZE/BANDAGES/DRESSINGS) IMPLANT
BIOPATCH RED 1 DISK 7.0 (GAUZE/BANDAGES/DRESSINGS) ×4 IMPLANT
BIOPATCH RED 1IN DISK 7.0MM (GAUZE/BANDAGES/DRESSINGS) ×2
BLADE 10 SAFETY STRL DISP (BLADE) ×3 IMPLANT
BLADE SURG 15 STRL LF DISP TIS (BLADE) ×1 IMPLANT
BLADE SURG 15 STRL SS (BLADE) ×2
CANISTER SUCTION 2500CC (MISCELLANEOUS) ×3 IMPLANT
CHLORAPREP W/TINT 26ML (MISCELLANEOUS) ×3 IMPLANT
CLIP APPLIE 9.375 MED OPEN (MISCELLANEOUS) ×1 IMPLANT
CONT SPEC 4OZ CLIKSEAL STRL BL (MISCELLANEOUS) ×3 IMPLANT
COVER SURGICAL LIGHT HANDLE (MISCELLANEOUS) ×3 IMPLANT
DERMABOND ADVANCED (GAUZE/BANDAGES/DRESSINGS) ×6
DERMABOND ADVANCED .7 DNX12 (GAUZE/BANDAGES/DRESSINGS) ×3 IMPLANT
DRAIN CHANNEL 19F RND (DRAIN) ×6 IMPLANT
DRAPE INCISE 23X17 IOBAN STRL (DRAPES) ×2
DRAPE INCISE IOBAN 23X17 STRL (DRAPES) ×1 IMPLANT
DRAPE ORTHO SPLIT 77X108 STRL (DRAPES) ×8
DRAPE PROXIMA HALF (DRAPES) ×6 IMPLANT
DRAPE SURG 17X23 STRL (DRAPES) ×12 IMPLANT
DRAPE SURG ORHT 6 SPLT 77X108 (DRAPES) ×4 IMPLANT
DRAPE WARM FLUID 44X44 (DRAPE) ×3 IMPLANT
DRESSING TELFA 8X10 (GAUZE/BANDAGES/DRESSINGS) ×3 IMPLANT
DRSG PAD ABDOMINAL 8X10 ST (GAUZE/BANDAGES/DRESSINGS) ×3 IMPLANT
DRSG SORBAVIEW 3.5X5-5/16 MED (GAUZE/BANDAGES/DRESSINGS) ×6 IMPLANT
DRSG TEGADERM 4X4.75 (GAUZE/BANDAGES/DRESSINGS) IMPLANT
DRSG TELFA 3X8 NADH (GAUZE/BANDAGES/DRESSINGS) ×3 IMPLANT
ELECT BLADE 6.5 EXT (BLADE) IMPLANT
ELECT CAUTERY BLADE 6.4 (BLADE) ×6 IMPLANT
ELECT REM PT RETURN 9FT ADLT (ELECTROSURGICAL) ×3
ELECTRODE REM PT RTRN 9FT ADLT (ELECTROSURGICAL) ×1 IMPLANT
EVACUATOR SILICONE 100CC (DRAIN) ×6 IMPLANT
EVACUATOR SMOKE ACCUVAC VALLEY (FILTER) ×4
EXPANDER BREAST ARTOURA 600CC (Breast) ×3 IMPLANT
GAUZE SPONGE 4X4 12PLY STRL (GAUZE/BANDAGES/DRESSINGS) ×3 IMPLANT
GAUZE XEROFORM 5X9 LF (GAUZE/BANDAGES/DRESSINGS) IMPLANT
GLOVE BIO SURGEON STRL SZ7.5 (GLOVE) ×6 IMPLANT
GLOVE BIOGEL PI IND STRL 7.0 (GLOVE) ×1 IMPLANT
GLOVE BIOGEL PI IND STRL 7.5 (GLOVE) ×1 IMPLANT
GLOVE BIOGEL PI IND STRL 8 (GLOVE) ×3 IMPLANT
GLOVE BIOGEL PI IND STRL 8.5 (GLOVE) ×1 IMPLANT
GLOVE BIOGEL PI INDICATOR 7.0 (GLOVE) ×2
GLOVE BIOGEL PI INDICATOR 7.5 (GLOVE) ×2
GLOVE BIOGEL PI INDICATOR 8 (GLOVE) ×6
GLOVE BIOGEL PI INDICATOR 8.5 (GLOVE) ×2
GLOVE ECLIPSE 7.5 STRL STRAW (GLOVE) ×3 IMPLANT
GLOVE SURG SS PI 7.0 STRL IVOR (GLOVE) ×3 IMPLANT
GOWN STRL REUS W/ TWL LRG LVL3 (GOWN DISPOSABLE) ×2 IMPLANT
GOWN STRL REUS W/ TWL XL LVL3 (GOWN DISPOSABLE) ×2 IMPLANT
GOWN STRL REUS W/TWL LRG LVL3 (GOWN DISPOSABLE) ×4
GOWN STRL REUS W/TWL XL LVL3 (GOWN DISPOSABLE) ×4
KIT BASIN OR (CUSTOM PROCEDURE TRAY) ×3 IMPLANT
KIT ROOM TURNOVER OR (KITS) ×3 IMPLANT
MARKER SKIN DUAL TIP RULER LAB (MISCELLANEOUS) ×6 IMPLANT
NS IRRIG 1000ML POUR BTL (IV SOLUTION) ×6 IMPLANT
PACK GENERAL/GYN (CUSTOM PROCEDURE TRAY) ×3 IMPLANT
PAD ARMBOARD 7.5X6 YLW CONV (MISCELLANEOUS) ×6 IMPLANT
PENCIL BUTTON HOLSTER BLD 10FT (ELECTRODE) ×3 IMPLANT
PREFILTER EVAC NS 1 1/3-3/8IN (MISCELLANEOUS) ×3 IMPLANT
SET ASEPTIC TRANSFER (MISCELLANEOUS) ×3 IMPLANT
SOL PREP POV-IOD 4OZ 10% (MISCELLANEOUS) ×3 IMPLANT
SPONGE LAP 18X18 X RAY DECT (DISPOSABLE) IMPLANT
STAPLER VISISTAT 35W (STAPLE) ×3 IMPLANT
SUT MNCRL AB 3-0 PS2 18 (SUTURE) ×3 IMPLANT
SUT MON AB 2-0 CT1 36 (SUTURE) ×6 IMPLANT
SUT PDS 2 0 CTB 1 36 (SUTURE) IMPLANT
SUT PDS AB 0 CT 36 (SUTURE) ×6 IMPLANT
SUT PROLENE 2 0 SH DA (SUTURE) ×12 IMPLANT
SUT PROLENE 3 0 PS 1 (SUTURE) ×6 IMPLANT
SUT PROLENE 3 0 PS 2 (SUTURE) IMPLANT
SUT VIC AB 3-0 FS2 27 (SUTURE) IMPLANT
SUT VIC AB 3-0 SH 18 (SUTURE) ×6 IMPLANT
SUT VIC AB 3-0 SH 8-18 (SUTURE) ×3 IMPLANT
SYR 50ML SLIP (SYRINGE) IMPLANT
SYR BULB IRRIGATION 50ML (SYRINGE) ×6 IMPLANT
TAPE CLOTH SURG 6X10 WHT LF (GAUZE/BANDAGES/DRESSINGS) ×3 IMPLANT
TOWEL OR 17X24 6PK STRL BLUE (TOWEL DISPOSABLE) ×3 IMPLANT
TOWEL OR 17X26 10 PK STRL BLUE (TOWEL DISPOSABLE) ×3 IMPLANT
TRAY FOLEY CATH 14FRSI W/METER (CATHETERS) ×3 IMPLANT
TUBE CONNECTING 12'X1/4 (SUCTIONS) ×2
TUBE CONNECTING 12X1/4 (SUCTIONS) ×4 IMPLANT

## 2015-06-11 NOTE — H&P (Signed)
I have re-examined and re-evaluated the patient and there are no changes.  See office notes in paper chart for H&P. 

## 2015-06-11 NOTE — Anesthesia Postprocedure Evaluation (Signed)
Anesthesia Post Note  Patient: [name]  Procedure(s) Performed: Procedure(s) (LRB): REMOVE AND REPLACE TISSUE EXPANDER RIGHT CHEST  (Right) TRANSPOSITION FLAP RIGHT CHEST   (Right)  Patient location during evaluation: PACU Anesthesia Type: General Level of consciousness: awake and alert, patient cooperative and oriented Pain management: pain level controlled Vital Signs Assessment: post-procedure vital signs reviewed and stable Respiratory status: spontaneous breathing, nonlabored ventilation and respiratory function stable Cardiovascular status: blood pressure returned to baseline and stable Postop Assessment: no signs of nausea or vomiting Anesthetic complications: no    Last Vitals:  Filed Vitals:   06/11/15 1430 06/11/15 1500  BP: 113/74 120/77  Pulse: 85 97  Temp:  36.5 C  Resp: 9 18    Last Pain:  Filed Vitals:   06/11/15 1501  PainSc: Asleep                 Aiza Vollrath,E. Caria Transue

## 2015-06-11 NOTE — Progress Notes (Signed)
Arrived to room 6n09 from PACU. Oriented to room and surroundings, denies nausea/pain at this time, husband at bedside

## 2015-06-11 NOTE — Anesthesia Procedure Notes (Signed)
Procedure Name: Intubation Date/Time: 06/11/2015 11:58 AM Performed by: Tressia Miners LEFFEW Pre-anesthesia Checklist: Patient identified, Patient being monitored, Timeout performed, Emergency Drugs available and Suction available Patient Re-evaluated:Patient Re-evaluated prior to inductionOxygen Delivery Method: Circle System Utilized Preoxygenation: Pre-oxygenation with 100% oxygen Intubation Type: IV induction Ventilation: Mask ventilation without difficulty Laryngoscope Size: Miller and 2 Grade View: Grade I Tube type: Oral Tube size: 7.0 mm Number of attempts: 1 Airway Equipment and Method: Stylet Placement Confirmation: ETT inserted through vocal cords under direct vision,  positive ETCO2 and breath sounds checked- equal and bilateral Secured at: 22 cm Tube secured with: Tape Dental Injury: Teeth and Oropharynx as per pre-operative assessment  Comments: Intubated by A. Mirarchi CRNA

## 2015-06-11 NOTE — Anesthesia Preprocedure Evaluation (Addendum)
Anesthesia Evaluation  Patient identified by MRN, date of birth, ID band Patient awake    Reviewed: Allergy & Precautions, NPO status , Patient's Chart, lab work & pertinent test results  History of Anesthesia Complications Negative for: history of anesthetic complications  Airway Mallampati: I  TM Distance: >3 FB Neck ROM: Full    Dental  (+) Edentulous Upper, Edentulous Lower   Pulmonary former smoker (quit '05),    breath sounds clear to auscultation       Cardiovascular negative cardio ROS   Rhythm:Regular Rate:Normal     Neuro/Psych Anxiety    GI/Hepatic Neg liver ROS, GERD  Controlled,  Endo/Other  Morbid obesity  Renal/GU negative Renal ROS     Musculoskeletal  (+) Arthritis , Fibromyalgia -  Abdominal (+) + obese,   Peds  Hematology   Anesthesia Other Findings DCIS  Reproductive/Obstetrics                            Anesthesia Physical Anesthesia Plan  ASA: II  Anesthesia Plan: General   Post-op Pain Management:    Induction: Intravenous  Airway Management Planned: Oral ETT  Additional Equipment:   Intra-op Plan:   Post-operative Plan: Extubation in OR  Informed Consent: I have reviewed the patients History and Physical, chart, labs and discussed the procedure including the risks, benefits and alternatives for the proposed anesthesia with the patient or authorized representative who has indicated his/her understanding and acceptance.   Dental advisory given  Plan Discussed with: CRNA and Surgeon  Anesthesia Plan Comments: (Plan routine monitors, GETA)        Anesthesia Quick Evaluation

## 2015-06-11 NOTE — Op Note (Signed)
[name], [name] NO.:  [id number]  MEDICAL RECORD NO.:  [id number]  LOCATION:  6N09C                        FACILITY:  [facility]  PHYSICIAN:  Crissie Reese, M.D.     DATE OF BIRTH:  07-27-1975  DATE OF PROCEDURE:  06/11/2015 DATE OF DISCHARGE:                              OPERATIVE REPORT   PREOPERATIVE DIAGNOSES: 1. DCIS of the breast. 2. Personal history of breast reconstruction. 3. Mechanical complication of a breast prosthesis with open wound     overlying tissue expander.  POSTOPERATIVE DIAGNOSES: 1. DCIS of the breast. 2. Personal history of breast reconstruction. 3. Mechanical complication of a breast prosthesis with open wound     overlying tissue expander.  PROCEDURE: 1. Removal of tissue expander and delayed breast reconstruction with a     tissue expander, right chest. 2. Distinct procedure a transposition (rhombic) flap to the inferior     mastectomy flap of the right chest.  SURGEON:  Crissie Reese, M.D.  ASSISTANT:  Judyann Munson, RNFA.  ANESTHESIA:  General.  ESTIMATED BLOOD LOSS:  20 mL.  DRAINS:  One 19-French.  CLINICAL NOTE:  This 40 year old woman has had a bilateral mastectomy and bilateral reconstruction with tissue expanders as well as acellular dermal matrix.  She had done well initially but did develop an area of skin compromise on the inferior mastectomy flap on the right side.  This gradually proceeded to a wound breakdown.  A primary repair of this was tried initially, and this did seem to work initially, but then that too broke down and it was noted a couple days ago that she did have exposure of her underlying tissue expander.  There has never been any evidence of any infection in the tissue expander in the space, and this all appears to be a problem of vascular compromise of the skin of the mastectomy flap.  Various options were discussed with her in detail.  These included just removing the expander, debriding  the wound, closing it, leaving the expander out for at least 6 months, and then proceeding with a delayed breast reconstruction at that point.  Another option latissimus flap with implant.  Then finally, an additional option was a wound debridement and exchange of the tissue expanders in a transposition flap from the upper abdominal skin just below the inframammary fold to close this area.  All of these options were discussed with her.  She preferred to avoid the latissimus flap with implant if possible.  She did state that she wanted to try to salvage of her breast reconstruction.  It was therefore felt that the transposition flap would be a good option.  It was felt that the tissue expander should be removed and exchanged simply because it had been exposed to the outside even though there was no evidence of infection.  The procedure was discussed with her in detail.  The risks and the possibility of bleeding, infection, healing problems, scarring, loss of sensation, fluid accumulations, anesthesia complications, failure of device, capsular contracture, displacement of device, wrinkles and ripples, loss of tissue, loss of portions of all the transposition flap, asymmetry, chronic pain, pneumothorax, DVT, PE.  All of these were discussed  with her in detail, and she understood all this and wished to proceed.  DESCRIPTION OF PROCEDURE:  The patient was marked in the holding area and then taken to the operating room and placed supine.  After successful administration of general anesthesia, she was prepped with Betadine and then draped with sterile drapes.  The wound was debrided and sent as a specimen.  The old mastectomy scar was opened at the mid aspect and dissection carried down through the subcutaneous tissue and through the muscle using electrocautery.  The underlying tissue expander was identified, deflated, and gently removed.  The space was inspected and noted to be in excellent  condition without any evidence of infection.  Thorough irrigation with saline as well as antibiotic solution.  A 19-French drain was positioned, brought out through a separate stab wound inferolaterally and secured with 3-0 Prolene suture. Tissue expander was brought into the field and soaked in antibiotic solution.  After thoroughly cleaning gloves, the expander was prepared placing 50 mL of sterile saline using a closed filling system.  All the air was removed, expander returned to the antibiotic solution, and then the space was again inspected and excellent hemostasis was confirmed. Capsulotomy was performed superiorly and laterally in order to allow for proper position of the tissue expander.  Hemostasis with electrocautery. Irrigation and then the tissue expander was positioned and care was taken to make sure it was oriented properly.  The closure with 3-0 Vicryl interrupted sutures for the muscle, 2-0 Monocryl interrupted inverted deep dermal sutures, and 2-0 Prolene interrupted sutures.  Attention was then directed to the open wound, inferior mastectomy flap. This area was measured carefully and the rhombic flap was then designed. The incision was made.  The flap was elevated full thickness.  It was a thick piece of tissue approximately 2 to 2.5 cm in thickness.  It was rotated into position and the donor defect closed with 2-0 Monocryl interrupted inverted deep dermal sutures and 2-0 Prolene simple interrupted sutures.  Once the donor defect had been closed, the flap was advanced into position with closure of its donor defect.  The flap had excellent color and bright red bleeding periphery consistent with viability.  Antibiotic solution was again used to irrigate and the flap was then inset with 2-0 Monocryl interrupted inverted deep sutures and 2- 0 Prolene simple interrupted sutures.  There was not any tension on the flap and the flap was continued to have excellent color.   Antibiotic ointment, Telfa, ABDs were applied, and the drain was dressed with Biopatch silver V.  She has tolerated the procedure well.     Crissie Reese, M.D.     DB/MEDQ  D:  06/11/2015  T:  06/11/2015  Job:  [id number]  cc:   Crissie Reese, M.D.

## 2015-06-11 NOTE — Brief Op Note (Signed)
06/11/2015  1:39 PM  PATIENT:  [name]  40 y.o. female  PRE-OPERATIVE DIAGNOSIS:  NECROTIC MASTECTOMY FLAP WITH EXPOSED TISSUE EXPANDER  POST-OPERATIVE DIAGNOSIS:  NECROTIC MASTECTOMY FLAP WITH EXPOSED TISSUE EXPANDER  PROCEDURE:  Procedure(s): REMOVE AND REPLACE TISSUE EXPANDER RIGHT CHEST  (Right) TRANSPOSITION FLAP RIGHT CHEST   (Right)  SURGEON:  Surgeon(s) and Role:    * Crissie Reese, MD - Primary  PHYSICIAN ASSISTANT:   ASSISTANTS: Judyann Munson, RNFA   ANESTHESIA:   general  EBL:  Total I/O In: 1000 [I.V.:1000] Out: 39 [Blood:50]  BLOOD ADMINISTERED:none  DRAINS: (1) Jackson-Pratt drain(s) with closed bulb suction in the right chest   LOCAL MEDICATIONS USED:  NONE  SPECIMEN:  Source of Specimen:  Open wound inferior mastectomy flap right chest  DISPOSITION OF SPECIMEN:  PATHOLOGY  COUNTS:  YES  TOURNIQUET:  * No tourniquets in log *  DICTATION: .Other Dictation: Dictation Number [id number]  PLAN OF CARE: Admit for overnight observation  PATIENT DISPOSITION:  PACU - hemodynamically stable.   Delay start of Pharmacological VTE agent (>24hrs) due to surgical blood loss or risk of bleeding: no

## 2015-06-11 NOTE — Transfer of Care (Signed)
Immediate Anesthesia Transfer of Care Note  Patient: [name]  Procedure(s) Performed: Procedure(s): REMOVE AND REPLACE TISSUE EXPANDER RIGHT CHEST  (Right) TRANSPOSITION FLAP RIGHT CHEST   (Right)  Patient Location: PACU  Anesthesia Type:General  Level of Consciousness: awake, alert , patient cooperative and responds to stimulation  Airway & Oxygen Therapy: Patient Spontanous Breathing and Patient connected to nasal cannula oxygen  Post-op Assessment: Report given to RN, Post -op Vital signs reviewed and stable and Patient moving all extremities X 4  Post vital signs: Reviewed and stable  Last Vitals:  Filed Vitals:   06/11/15 0824 06/11/15 1351  BP: 143/90 122/73  Pulse: 98 91  Temp: 36.7 C 36.5 C  Resp: 20 12    Complications: No apparent anesthesia complications

## 2015-06-12 ENCOUNTER — Encounter (HOSPITAL_COMMUNITY): Payer: Self-pay | Admitting: Plastic Surgery

## 2015-06-12 DIAGNOSIS — T8189XA Other complications of procedures, not elsewhere classified, initial encounter: Secondary | ICD-10-CM | POA: Diagnosis not present

## 2015-06-12 MED ORDER — DOXYCYCLINE HYCLATE 100 MG PO TABS
100.0000 mg | ORAL_TABLET | Freq: Two times a day (BID) | ORAL | Status: DC
  Administered 2015-06-12: 100 mg via ORAL

## 2015-06-12 MED ORDER — METHOCARBAMOL 500 MG PO TABS: 500.0000 mg | ORAL_TABLET | Freq: Four times a day (QID) | ORAL | Status: DC

## 2015-06-12 MED ORDER — HYDROMORPHONE HCL 2 MG PO TABS: 2.0000 mg | ORAL_TABLET | ORAL | Status: DC | PRN

## 2015-06-12 MED ORDER — ENOXAPARIN SODIUM 40 MG/0.4ML ~~LOC~~ SOLN: 40.0000 mg | SUBCUTANEOUS | Status: DC

## 2015-06-12 MED ORDER — DOXYCYCLINE HYCLATE 100 MG PO TABS: 100.0000 mg | ORAL_TABLET | Freq: Two times a day (BID) | ORAL | Status: DC

## 2015-06-12 NOTE — Discharge Instructions (Addendum)
No lifting for 6 weeks No vigorous activity for 6 weeks (including outdoor walks) No driving for 4 weeks OK to walk up stairs slowly Stay propped up Use incentive spirometer at home every hour while awake No lifting, no housework, no raising arms overhead No shower while drains are in place Empty drains at least three times a day and record the amounts separately Change drain dressings every third day if instructed to do so by Dr. Harlow Mares (no need to begin this until after return to office)  Apply Bacitracin antibiotic ointment to the drain sites  Place gauze dressing over drains  Secure the gauze with tape Take an over-the-counter Probiotic while on antibiotics Take an over-the-counter stool softener (such as Colace) while on pain medication See Dr. Harlow Mares nest week in office For questions call [phone] or [phone]

## 2015-06-12 NOTE — Discharge Summary (Signed)
Physician Discharge Summary  Patient ID: [name] MRN: [id number] DOB/AGE: Aug 11, 1975 40 y.o.  Admit date: 06/11/2015 Discharge date: 06/12/2015  Admission Diagnoses: Left breast cancer. Necrotic mastectomy flap right side with exposed tissue expander (open woundright chest)  Discharge Diagnoses: Same Active Problems:   Open wound of chest wall, complicated   Discharged Condition: good  Hospital Course: On the day of admission the patient was taken to surgery and had debridement of wound right chest, exchange of tissue expander, and transposition flap to close wound. The patient tolerated the procedures well. Postoperatively, the flap maintained excellent color and capillary refill. The patient was ambulatory and tolerating diet on the first postoperative day. Ready for discharge.  Treatments: antibiotics: Ancef and Cipro, anticoagulation: LMW heparin and surgery: right wound debridement, tissue expander exchange, and transposition flap for wound closure right chest  Discharge Exam: Blood pressure 110/63, pulse 81, temperature 98.1 F (36.7 C), temperature source Oral, resp. rate 18, height 5\' 1"  (1.549 m), weight 168 lb (76.204 kg), last menstrual period 05/10/2015, SpO2 99 %.  Operative sites: Mastectomy flaps viable. Flap vible. Tissue expander in position. Drains functioning. Drainage thin. There is no evidence of bleeding or infection.  Disposition: 01-Home or Self Care     Medication List    STOP taking these medications        polymixin-bacitracin 500-10000 UNIT/GM Oint ointment      TAKE these medications        acetaminophen 500 MG tablet  Commonly known as:  TYLENOL  Take 1,000 mg by mouth every 6 (six) hours as needed for moderate pain or headache.     ALPRAZolam 0.5 MG tablet  Commonly known as:  XANAX  Take 0.75 mg by mouth at bedtime as needed for anxiety.     amphetamine-dextroamphetamine 20 MG tablet  Commonly known as:  ADDERALL  Take 20 mg by  mouth 2 (two) times daily as needed.     docusate sodium 100 MG capsule  Commonly known as:  COLACE  Take 1 capsule (100 mg total) by mouth 2 (two) times daily.     doxycycline 100 MG tablet  Commonly known as:  VIBRA-TABS  Take 1 tablet (100 mg total) by mouth every 12 (twelve) hours.     doxycycline 100 MG tablet  Commonly known as:  VIBRA-TABS  Take 1 tablet (100 mg total) by mouth every 12 (twelve) hours.     enoxaparin 40 MG/0.4ML injection  Commonly known as:  LOVENOX  Inject 0.4 mLs (40 mg total) into the skin daily.     enoxaparin 40 MG/0.4ML injection  Commonly known as:  LOVENOX  Inject 0.4 mLs (40 mg total) into the skin daily.     hydrocortisone cream 1 %  Apply 1 application topically as needed for itching.     HYDROmorphone 2 MG tablet  Commonly known as:  DILAUDID  Take 1-2 tablets (2-4 mg total) by mouth every 4 (four) hours as needed for moderate pain or severe pain.     HYDROmorphone 2 MG tablet  Commonly known as:  DILAUDID  Take 1-2 tablets (2-4 mg total) by mouth every 4 (four) hours as needed for moderate pain or severe pain.     Magnesium 200 MG Tabs  Take by mouth.     methocarbamol 500 MG tablet  Commonly known as:  ROBAXIN  Take 1 tablet (500 mg total) by mouth 4 (four) times daily.     methocarbamol 500 MG tablet  Commonly known as:  ROBAXIN  Take 1 tablet (500 mg total) by mouth 4 (four) times daily.     vitamin C 1000 MG tablet  Take 1,000 mg by mouth daily.     ZINC PO  Take by mouth.         SignedHarlow Mares, Mecca Guitron M 06/12/2015, 7:33 AM

## 2015-07-21 ENCOUNTER — Encounter (HOSPITAL_COMMUNITY): Payer: Self-pay

## 2016-04-21 NOTE — Assessment & Plan Note (Deleted)
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 Breast Cancer Surveillance: 1. Breast exam 04/22/16: NBil mastectomies 2. Mammogram: No role   Return to clinic in 1 year

## 2016-04-22 ENCOUNTER — Ambulatory Visit: Payer: BLUE CROSS/BLUE SHIELD | Admitting: Hematology and Oncology

## 2016-04-22 NOTE — Progress Notes (Deleted)
Patient Care Team: Cleone Slim. Rozetta Nunnery, MD as PCP - General (Internal Medicine) Nicholas Lose, MD as Consulting Physician (Hematology and Oncology) Donnie Mesa, MD as Consulting Physician (General Surgery) Arloa Koh, MD as Consulting Physician (Radiation Oncology) Sylvan Cheese, NP as Nurse Practitioner (Hematology and Oncology) Crissie Reese, MD as Consulting Physician (Plastic Surgery)  DIAGNOSIS:  Encounter Diagnosis  Name Primary?  . Malignant neoplasm of upper-outer quadrant of left breast in female, estrogen receptor negative ([place])     SUMMARY OF ONCOLOGIC HISTORY:   Breast cancer of upper-outer quadrant of left female breast ([place])   02/18/2015 Mammogram    Left breast calcs10 x 12.5 x 9.5 cm U/S 4.5 cm mass, prominent left axillary lymph node, dense breast. Right breast: masses in the subareolar region and in UOQ benign appearing calcifications, U/S multiple cysts 1.4 cm      02/18/2015 Initial Biopsy    Left breast biopsies (anterior and posterior) 12:00: DCIS with necrosis and calcifications ER 0%, PR 0%, high-grade      02/18/2015 Clinical Stage    Stage 0: Tis N0      02/26/2015 Breast MRI    Non mass enhancement comprises greater than 50% of the left breast, measuring approximately 10.5 x 9.5 x 8.1 cm, consistent with biopsy-proven extensive high-grade ductal carcinoma in situ.      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 She is here for annual checkup. She denies any lumps or nodules. Her health has been fairly good.  REVIEW OF SYSTEMS:   Constitutional: Denies fevers, chills or abnormal weight loss Eyes: Denies blurriness of vision Ears, nose, mouth, throat, and face: Denies mucositis or sore throat Respiratory: Denies cough, dyspnea or wheezes Cardiovascular: Denies palpitation, chest discomfort Gastrointestinal:  Denies nausea, heartburn or change in bowel habits Skin: Denies abnormal skin rashes Lymphatics: Denies new lymphadenopathy or easy bruising Neurological:Denies numbness, tingling or new weaknesses Behavioral/Psych: Mood is stable, no new changes  Extremities: No lower extremity edema Breast:  denies any pain or lumps or nodules in either breasts All other systems were reviewed with the patient and are negative.  I have reviewed the past medical history, past surgical history, social history and family history with the patient and they are unchanged from previous note.  ALLERGIES:  is allergic to bee pollen; erythromycin; nitrofurantoin monohyd macro; pyridium [phenazopyridine hcl]; shellfish allergy; and zithromax [azithromycin dihydrate].  MEDICATIONS:  Current Outpatient Prescriptions  Medication Sig Dispense Refill  . acetaminophen (TYLENOL) 500 MG tablet Take 1,000 mg by mouth every 6 (six) hours as needed for moderate pain or headache.    . ALPRAZolam (XANAX) 0.5 MG tablet Take 0.75 mg by mouth at bedtime as needed for anxiety.     Marland Kitchen amphetamine-dextroamphetamine (ADDERALL) 20 MG tablet Take 20  mg by mouth 2 (two) times daily as needed.    . Ascorbic Acid (VITAMIN C) 1000 MG tablet Take 1,000 mg by mouth daily.    Marland Kitchen docusate sodium (COLACE) 100 MG  capsule Take 1 capsule (100 mg total) by mouth 2 (two) times daily. (Patient not taking: Reported on 06/10/2015) 10 capsule 0  . doxycycline (VIBRA-TABS) 100 MG tablet Take 1 tablet (100 mg total) by mouth every 12 (twelve) hours. 30 tablet 0  . doxycycline (VIBRA-TABS) 100 MG tablet Take 1 tablet (100 mg total) by mouth every 12 (twelve) hours. 20 tablet 0  . enoxaparin (LOVENOX) 40 MG/0.4ML injection Inject 0.4 mLs (40 mg total) into the skin daily. (Patient not taking: Reported on 06/10/2015) 12 Syringe 0  . enoxaparin (LOVENOX) 40 MG/0.4ML injection Inject 0.4 mLs (40 mg total) into the skin daily. 12 Syringe 0  . hydrocortisone cream 1 % Apply 1 application topically as needed for itching.    Marland Kitchen HYDROmorphone (DILAUDID) 2 MG tablet Take 1-2 tablets (2-4 mg total) by mouth every 4 (four) hours as needed for moderate pain or severe pain. 40 tablet 0  . HYDROmorphone (DILAUDID) 2 MG tablet Take 1-2 tablets (2-4 mg total) by mouth every 4 (four) hours as needed for moderate pain or severe pain. 30 tablet 0  . Magnesium 200 MG TABS Take by mouth.    . methocarbamol (ROBAXIN) 500 MG tablet Take 1 tablet (500 mg total) by mouth 4 (four) times daily. (Patient not taking: Reported on 06/10/2015) 40 tablet 1  . methocarbamol (ROBAXIN) 500 MG tablet Take 1 tablet (500 mg total) by mouth 4 (four) times daily. 40 tablet 0  . Multiple Vitamins-Minerals (ZINC PO) Take by mouth.     No current facility-administered medications for this visit.     PHYSICAL EXAMINATION: ECOG PERFORMANCE STATUS: 0 - Asymptomatic  There were no vitals filed for this visit. There were no vitals filed for this visit.  GENERAL:alert, no distress and comfortable SKIN: skin color, texture, turgor are normal, no rashes or significant lesions EYES: normal, Conjunctiva are pink and non-injected, sclera clear OROPHARYNX:no exudate, no erythema and lips, buccal mucosa, and tongue normal  NECK: supple, thyroid normal size, non-tender,  without nodularity LYMPH:  no palpable lymphadenopathy in the cervical, axillary or inguinal LUNGS: clear to auscultation and percussion with normal breathing effort HEART: regular rate & rhythm and no murmurs and no lower extremity edema ABDOMEN:abdomen soft, non-tender and normal bowel sounds MUSCULOSKELETAL:no cyanosis of digits and no clubbing  NEURO: alert & oriented x 3 with fluent speech, no focal motor/sensory deficits EXTREMITIES: No lower extremity edema BREAST: No palpable masses or nodules in either right or left reconstructed breasts. No palpable axillary supraclavicular or infraclavicular adenopathy no breast tenderness or nipple discharge. (exam performed in the presence of a chaperone)  LABORATORY DATA:  I have reviewed the data as listed   Chemistry      Component Value Date/Time   NA 139 06/11/2015 0850   K 3.9 06/11/2015 0850   CL 105 06/11/2015 0850   CO2 25 06/11/2015 0850   BUN 13 06/11/2015 0850   CREATININE 0.69 06/11/2015 0850      Component Value Date/Time   CALCIUM 9.2 06/11/2015 0850       Lab Results  Component Value Date   WBC 7.2 06/11/2015   HGB 13.2 06/11/2015   HCT 40.7 06/11/2015   MCV 90.4 06/11/2015   PLT 271 06/11/2015   NEUTROABS 4.3 08/30/2012  ASSESSMENT & PLAN:  Breast cancer of upper-outer quadrant of left female breast ([place]) Bilateral mastectomies with immediate reconstruction 04/09/2015: left mastectomy: High-grade DCIS with necrosis and calcifications, 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 Breast Cancer Surveillance: 1. Breast exam 04/22/16: Bil mastectomies with reconstructive breasts. No palpable lumps or nodules maxilla. 2. Mammogram: No role since she had bilateral mastectomies  Return to clinic in 1 year with survivorship clinic   No orders of the defined types were placed in  this encounter.  The patient has a good understanding of the overall plan. she agrees with it. she will call with any problems that may develop before the next visit here.   Rulon Eisenmenger, MD 04/22/16

## 2016-04-27 IMAGING — MR MR BREAST BX W/ LOC DEV 1ST LEASION IMAGE BX SPEC MR GUIDE*R*
6 of 9 series · 33 of 48 positions shown · IV contrast (15 ml Multihance)
Comparison: Previous exams.

ADDENDUM:
Pathology reveals Right breast pseudoangiomatous stromal hyperplasia
(PASH) and fibrocystic changes with usual ductal hyperplasia. Benign
Left axillary lymph node with sinus histiocytosis. Pathology results
were discussed with the patient via telephone. The patient reported
no problems with the biopsy sites and is doing well. Post biopsy
care and instructions were reviewed and questions were answered. The
patient was encouraged to call [REDACTED] with any additional questions and or concerns. The patient
has a recent diagnosis of Left breast cancer and was instructed to
follow her treatment plan. The patient has decided to have a
bilateral mastectomy.

Pathology results reported by Gus Feinberg Rn on March 05, 2015.
CLINICAL DATA: Patient with history of newly diagnosed extensive
left breast DCIS with multiple areas of suspicious non mass
enhancement within the right breast on recent MRI. Area of non mass
enhancement right breast 12 o'clock targeted for biopsy.
EXAM:
MRI GUIDED CORE NEEDLE BIOPSY OF THE right BREAST
TECHNIQUE: Multiplanar, multisequence MR imaging of the right breast was
performed both before and after administration of intravenous
contrast.
CONTRAST:  15 mL MultiHance IV.

[Series 2: axial pre-cm · axial · non-contrast · 1.3mm · 0.73mm/px · z∈[-101,+85]mm · 6 of 144 slices shown]
[im 1/144]
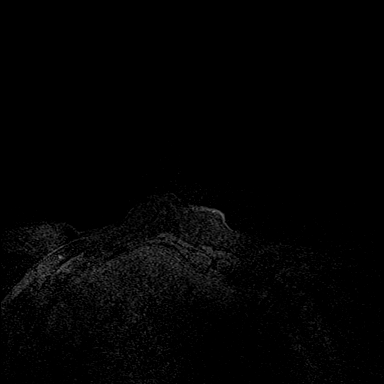
[im 29/144]
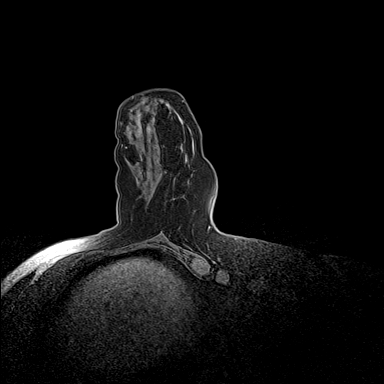
[im 58/144]
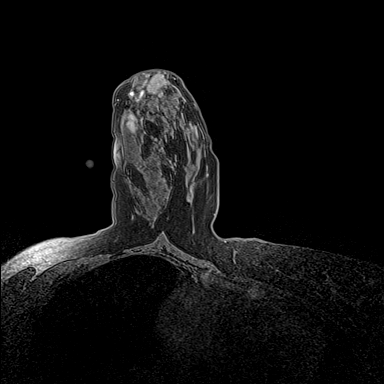
[im 86/144]
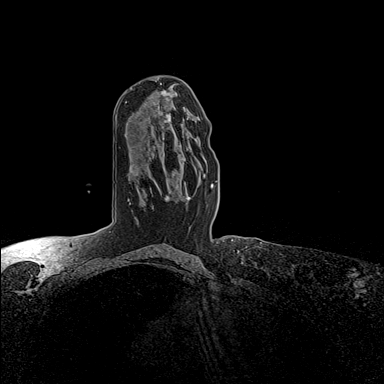
[im 115/144]
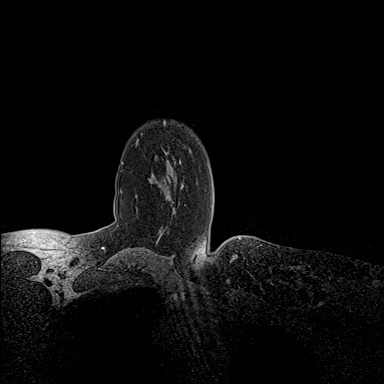
[im 144/144]
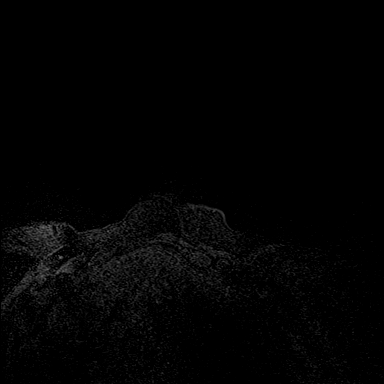

[Series 3: axial pre-cm_s2_(id) · axial · non-contrast · 1.3mm · 0.73mm/px · z∈[-101,+85]mm · 6 of 144 slices shown]
[im 1/144]
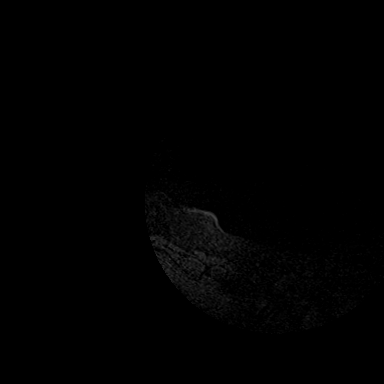
[im 29/144]
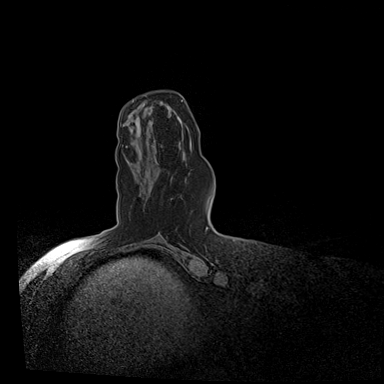
[im 58/144]
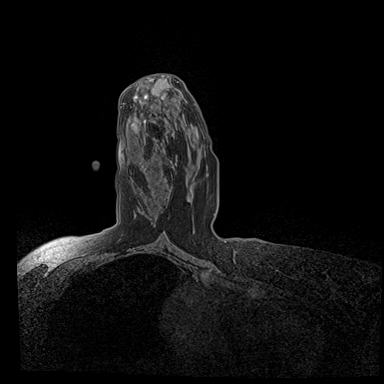
[im 86/144]
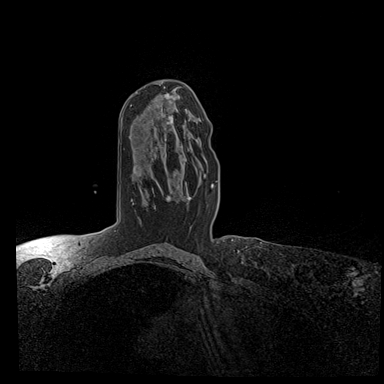
[im 115/144]
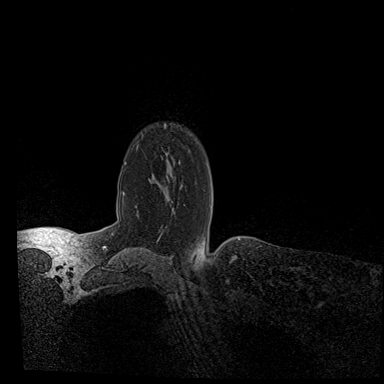
[im 144/144]
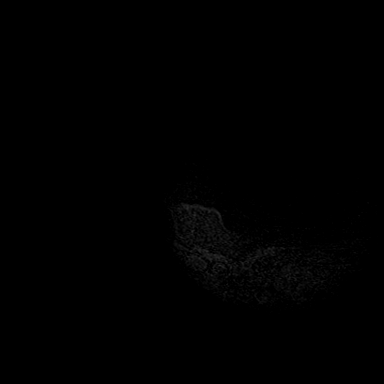

[Series 4: axial post 20 · axial · 1.3mm · 0.73mm/px · z∈[-101,+85]mm · 6 of 144 slices shown (1 of 3)]
[im 1/144]
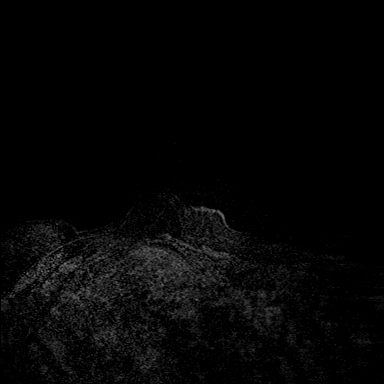
[im 29/144]
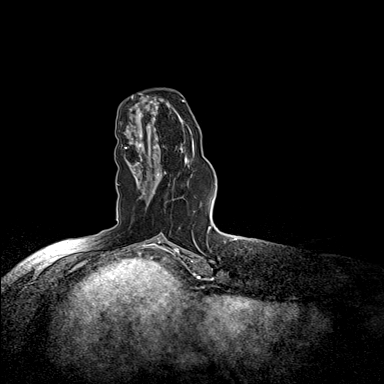
[im 58/144]
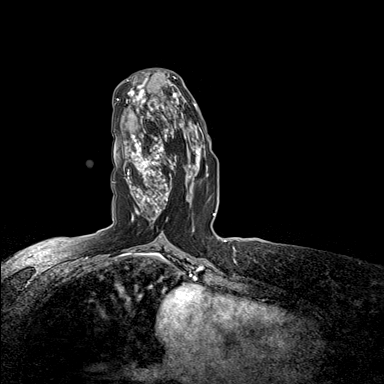
[im 86/144]
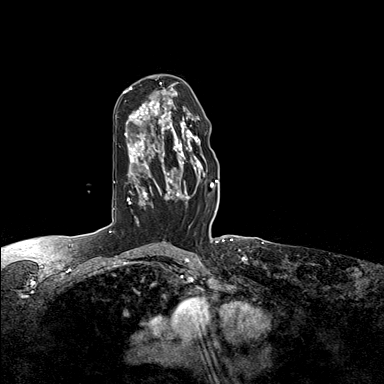
[im 115/144]
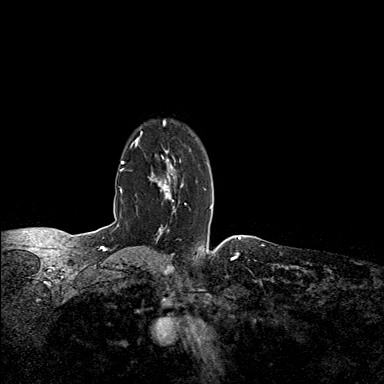
[im 144/144]
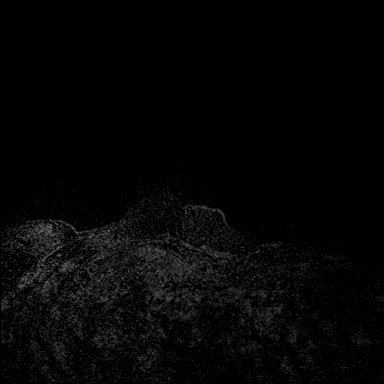

[Series 5: axial post 20 · axial · 1.3mm · 0.73mm/px · z∈[-101,+85]mm · 5 of 144 slices shown (2 of 3)]
[im 1/144]
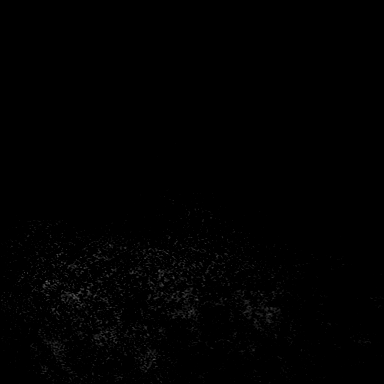
[im 36/144]
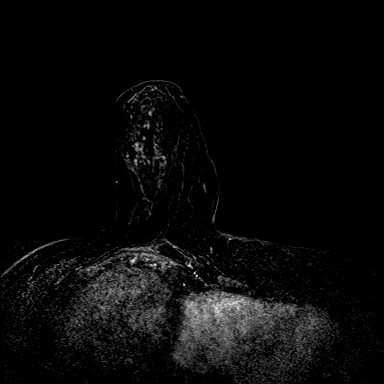
[im 72/144]
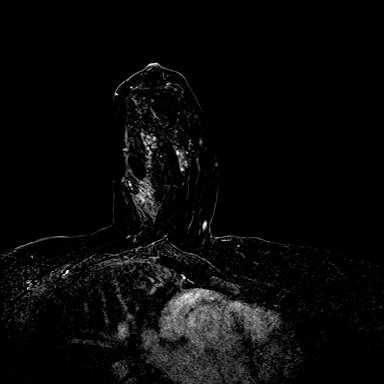
[im 108/144]
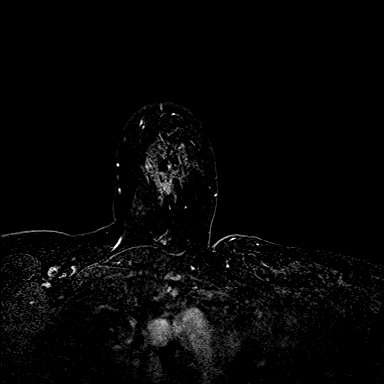
[im 144/144]
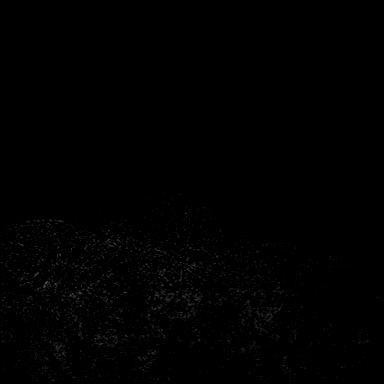

[Series 6: axial post 20 · axial · 1.3mm · 0.73mm/px · z∈[-101,+85]mm · 5 of 144 slices shown (3 of 3)]
[im 1/144]
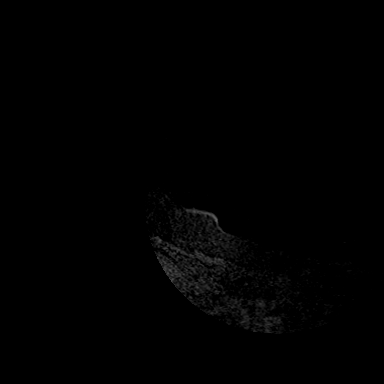
[im 36/144]
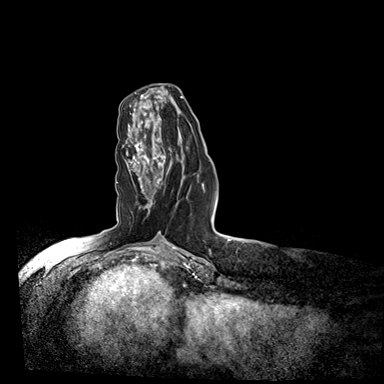
[im 72/144]
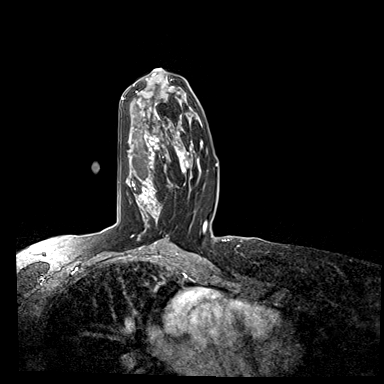
[im 108/144]
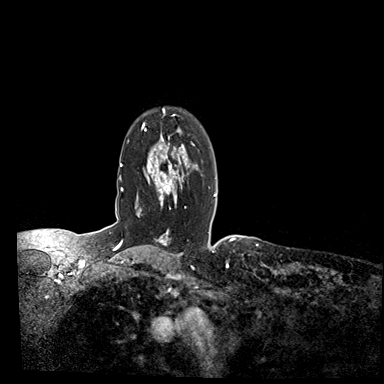
[im 144/144]
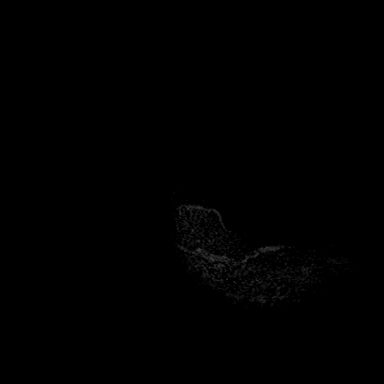

[Series 7: axial confirmation · axial · 1.3mm · 0.73mm/px · z∈[-101,+85]mm · 5 of 144 slices shown]
[im 1/144]
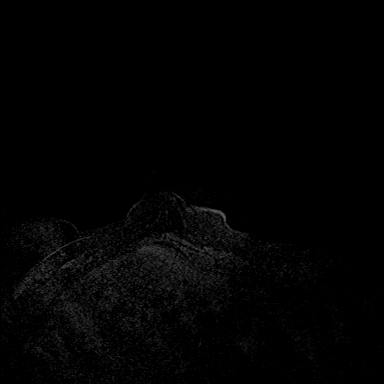
[im 36/144]
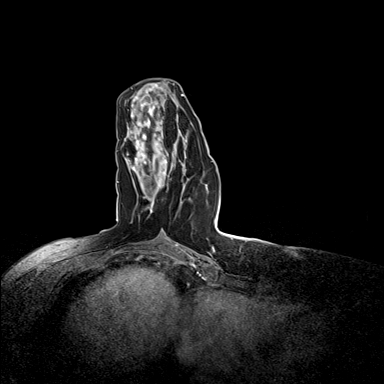
[im 72/144]
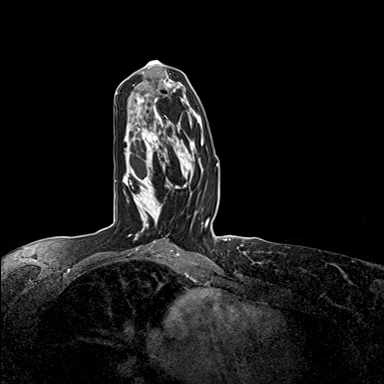
[im 108/144]
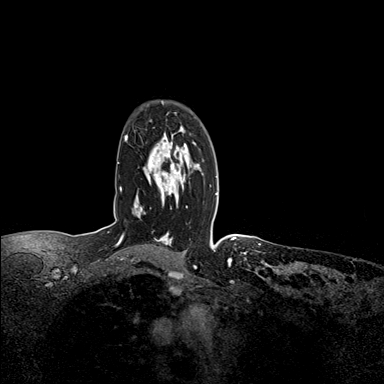
[im 144/144]
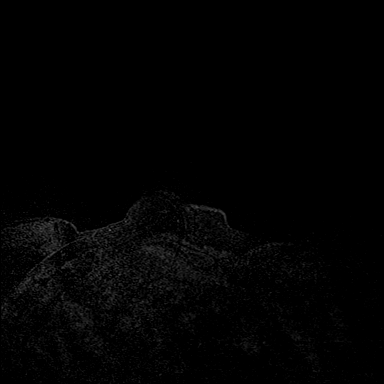

[33 of 48 positions shown; findings below may reference images not displayed]

FINDINGS: I met with the patient, and we discussed the procedure of MRI guided
biopsy, including risks, benefits, and alternatives. Specifically,
we discussed the risks of infection, bleeding, tissue injury, clip
migration, and inadequate sampling. Informed, written consent was
given. The usual time out protocol was performed immediately prior
to the procedure.

Using sterile technique, 2% Lidocaine, MRI guidance, and a 9 gauge
vacuum assisted device, biopsy was performed of the targeted non
mass enhancement over the 12 o'clock position using a lateral to
medial approach. Seven adequate tissue specimens were obtained as
patient tolerated the procedure without complications. Post biopsy
images confirm biopsy cavity in appropriate location. At the
conclusion of the procedure, a dumbbell shaped tissue marker clip
was deployed into the biopsy cavity. Follow-up 2-view mammogram was
performed and dictated separately.
IMPRESSION: MRI guided biopsy of right breast. No apparent complications.

## 2016-06-07 ENCOUNTER — Telehealth: Payer: Self-pay | Admitting: Hematology and Oncology

## 2016-06-07 NOTE — Telephone Encounter (Signed)
Patient called to reschedule missed follow up appointment scheduled for 04/22/16. The patient states that she missed the appointment due to being on bedrest. Rescheduled to next available.

## 2016-06-13 NOTE — Assessment & Plan Note (Deleted)
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 Breast exam 06/14/16: Normal 2. Mammogram  No abnormalities. Postsurgical changes. Breast Density Category. I recommended that she get 3-D mammograms for surveillance. Discussed the differences between different breast density categories.

## 2016-06-14 ENCOUNTER — Ambulatory Visit: Payer: BLUE CROSS/BLUE SHIELD | Admitting: Hematology and Oncology

## 2016-06-14 NOTE — Progress Notes (Deleted)
Patient Care Team: Cleone Slim. Rozetta Nunnery, MD as PCP - General (Internal Medicine) Nicholas Lose, MD as Consulting Physician (Hematology and Oncology) Donnie Mesa, MD as Consulting Physician (General Surgery) Arloa Koh, MD as Consulting Physician (Radiation Oncology) Sylvan Cheese, NP as Nurse Practitioner (Hematology and Oncology) Crissie Reese, MD as Consulting Physician (Plastic Surgery)  DIAGNOSIS:  Encounter Diagnosis  Name Primary?  . Malignant neoplasm of upper-outer quadrant of left breast in female, estrogen receptor negative ([place])     SUMMARY OF ONCOLOGIC HISTORY:   Breast cancer of upper-outer quadrant of left female breast ([place])   02/18/2015 Mammogram    Left breast calcs10 x 12.5 x 9.5 cm U/S 4.5 cm mass, prominent left axillary lymph node, dense breast. Right breast: masses in the subareolar region and in UOQ benign appearing calcifications, U/S multiple cysts 1.4 cm      02/18/2015 Initial Biopsy    Left breast biopsies (anterior and posterior) 12:00: DCIS with necrosis and calcifications ER 0%, PR 0%, high-grade      02/18/2015 Clinical Stage    Stage 0: Tis N0      02/26/2015 Breast MRI    Non mass enhancement comprises greater than 50% of the left breast, measuring approximately 10.5 x 9.5 x 8.1 cm, consistent with biopsy-proven extensive high-grade ductal carcinoma in situ.      03/11/2015 Procedure    genetic testing normal      03/13/2015 Procedure    Breast/Ovarian panel reveals no clinically significant variable at ATM, BARD1, BRCA1, BRCA2, BRIP1, CDH1, CHEK2, EPCAM, FANCC, MLH1, MSH2, MSH6, NBN, PALB2, PMS2, PTEN, RAD51C, RAD51D, TP53, and XRCC2      04/09/2015 Definitive Surgery    Bilateral mastectomies with immediate reconstruction/SLNB: left mastectomy: High-grade DCIS with necrosis and calcifications, 11 cm, margins negative,0/1 lymph node negative; right mastectomy benign      04/09/2015 Pathologic Stage    Stage 0: Tis N0        CHIEF COMPLIANT: Surveillance for breast cancer  INTERVAL HISTORY: [name] is a 41 year old with above-mentioned history of bilateral mastectomies for DCIS who is here for a surveillance checkup and follow-up. She denies any new complaints or concerns. Denies any new lumps or nodules.  REVIEW OF SYSTEMS:   Constitutional: Denies fevers, chills or abnormal weight loss Eyes: Denies blurriness of vision Ears, nose, mouth, throat, and face: Denies mucositis or sore throat Respiratory: Denies cough, dyspnea or wheezes Cardiovascular: Denies palpitation, chest discomfort Gastrointestinal:  Denies nausea, heartburn or change in bowel habits Skin: Denies abnormal skin rashes Lymphatics: Denies new lymphadenopathy or easy bruising Neurological:Denies numbness, tingling or new weaknesses Behavioral/Psych: Mood is stable, no new changes  Extremities: No lower extremity edema Breast: Bilateral mastectomies All other systems were reviewed with the patient and are negative.  I have reviewed the past medical history, past surgical history, social history and family history with the patient and they are unchanged from previous note.  ALLERGIES:  is allergic to bee pollen; erythromycin; nitrofurantoin monohyd macro; pyridium [phenazopyridine hcl]; shellfish allergy; and zithromax [azithromycin dihydrate].  MEDICATIONS:  Current Outpatient Prescriptions  Medication Sig Dispense Refill  . acetaminophen (TYLENOL) 500 MG tablet Take 1,000 mg by mouth every 6 (six) hours as needed for moderate pain or headache.    . ALPRAZolam (XANAX) 0.5 MG tablet Take 0.75 mg by mouth at bedtime as needed for anxiety.     Marland Kitchen amphetamine-dextroamphetamine (ADDERALL) 20 MG tablet Take 20 mg by mouth 2 (two) times daily as needed.    Marland Kitchen  Ascorbic Acid (VITAMIN C) 1000 MG tablet Take 1,000 mg by mouth daily.    Marland Kitchen docusate sodium (COLACE) 100 MG capsule Take 1 capsule (100 mg total) by mouth 2 (two) times daily.  (Patient not taking: Reported on 06/10/2015) 10 capsule 0  . doxycycline (VIBRA-TABS) 100 MG tablet Take 1 tablet (100 mg total) by mouth every 12 (twelve) hours. 30 tablet 0  . doxycycline (VIBRA-TABS) 100 MG tablet Take 1 tablet (100 mg total) by mouth every 12 (twelve) hours. 20 tablet 0  . enoxaparin (LOVENOX) 40 MG/0.4ML injection Inject 0.4 mLs (40 mg total) into the skin daily. (Patient not taking: Reported on 06/10/2015) 12 Syringe 0  . enoxaparin (LOVENOX) 40 MG/0.4ML injection Inject 0.4 mLs (40 mg total) into the skin daily. 12 Syringe 0  . hydrocortisone cream 1 % Apply 1 application topically as needed for itching.    Marland Kitchen HYDROmorphone (DILAUDID) 2 MG tablet Take 1-2 tablets (2-4 mg total) by mouth every 4 (four) hours as needed for moderate pain or severe pain. 40 tablet 0  . HYDROmorphone (DILAUDID) 2 MG tablet Take 1-2 tablets (2-4 mg total) by mouth every 4 (four) hours as needed for moderate pain or severe pain. 30 tablet 0  . Magnesium 200 MG TABS Take by mouth.    . methocarbamol (ROBAXIN) 500 MG tablet Take 1 tablet (500 mg total) by mouth 4 (four) times daily. (Patient not taking: Reported on 06/10/2015) 40 tablet 1  . methocarbamol (ROBAXIN) 500 MG tablet Take 1 tablet (500 mg total) by mouth 4 (four) times daily. 40 tablet 0  . Multiple Vitamins-Minerals (ZINC PO) Take by mouth.     No current facility-administered medications for this visit.     PHYSICAL EXAMINATION: ECOG PERFORMANCE STATUS: 0 - Asymptomatic  There were no vitals filed for this visit. There were no vitals filed for this visit.  GENERAL:alert, no distress and comfortable SKIN: skin color, texture, turgor are normal, no rashes or significant lesions EYES: normal, Conjunctiva are pink and non-injected, sclera clear OROPHARYNX:no exudate, no erythema and lips, buccal mucosa, and tongue normal  NECK: supple, thyroid normal size, non-tender, without nodularity LYMPH:  no palpable lymphadenopathy in the  cervical, axillary or inguinal LUNGS: clear to auscultation and percussion with normal breathing effort HEART: regular rate & rhythm and no murmurs and no lower extremity edema ABDOMEN:abdomen soft, non-tender and normal bowel sounds MUSCULOSKELETAL:no cyanosis of digits and no clubbing  NEURO: alert & oriented x 3 with fluent speech, no focal motor/sensory deficits EXTREMITIES: No lower extremity edema BREAST: Bilateral mastectomies No palpable axillary supraclavicular or infraclavicular adenopathy. (exam performed in the presence of a chaperone)  LABORATORY DATA:  I have reviewed the data as listed   Chemistry      Component Value Date/Time   NA 139 06/11/2015 0850   K 3.9 06/11/2015 0850   CL 105 06/11/2015 0850   CO2 25 06/11/2015 0850   BUN 13 06/11/2015 0850   CREATININE 0.69 06/11/2015 0850      Component Value Date/Time   CALCIUM 9.2 06/11/2015 0850       Lab Results  Component Value Date   WBC 7.2 06/11/2015   HGB 13.2 06/11/2015   HCT 40.7 06/11/2015   MCV 90.4 06/11/2015   PLT 271 06/11/2015   NEUTROABS 4.3 08/30/2012    ASSESSMENT & PLAN:  Breast cancer of upper-outer quadrant of left female breast ([place]) Bilateral mastectomies with immediate reconstruction 04/09/2015: left mastectomy: High-grade DCIS with necrosis and calcifications, 11  cm, margins negative,0/1 lymph node negative; right mastectomy benign Tis N0 stage 0, ER 0%, PR 0%  Breast Cancer Surveillance: 1. Breast exam 06/14/16: Normal 2. Mammogram  No abnormalities. Postsurgical changes. Breast Density Category. I recommended that she get 3-D mammograms for surveillance. Discussed the differences between different breast density categories.   I spent 15 minutes talking to the patient of which more than half was spent in counseling and coordination of care.  No orders of the defined types were placed in this encounter.  The patient has a good understanding of the overall plan. she agrees with it.  she will call with any problems that may develop before the next visit here.   Rulon Eisenmenger, MD 06/14/16

## 2016-07-11 ENCOUNTER — Encounter (HOSPITAL_COMMUNITY): Payer: Self-pay

## 2016-09-01 ENCOUNTER — Emergency Department (HOSPITAL_BASED_OUTPATIENT_CLINIC_OR_DEPARTMENT_OTHER)
Admission: EM | Admit: 2016-09-01 | Discharge: 2016-09-01 | Disposition: A | Discharge status: Home / Self Care | Payer: BLUE CROSS/BLUE SHIELD | Attending: Emergency Medicine | Admitting: Emergency Medicine

## 2016-09-01 ENCOUNTER — Encounter (HOSPITAL_BASED_OUTPATIENT_CLINIC_OR_DEPARTMENT_OTHER): Payer: Self-pay | Admitting: *Deleted

## 2016-09-01 DIAGNOSIS — Z87891 Personal history of nicotine dependence: Secondary | ICD-10-CM | POA: Insufficient documentation

## 2016-09-01 DIAGNOSIS — Y929 Unspecified place or not applicable: Secondary | ICD-10-CM | POA: Insufficient documentation

## 2016-09-01 DIAGNOSIS — T22211A Burn of second degree of right forearm, initial encounter: Secondary | ICD-10-CM

## 2016-09-01 DIAGNOSIS — Y999 Unspecified external cause status: Secondary | ICD-10-CM | POA: Insufficient documentation

## 2016-09-01 DIAGNOSIS — Z79899 Other long term (current) drug therapy: Secondary | ICD-10-CM | POA: Insufficient documentation

## 2016-09-01 DIAGNOSIS — X150XXA Contact with hot stove (kitchen), initial encounter: Secondary | ICD-10-CM | POA: Insufficient documentation

## 2016-09-01 DIAGNOSIS — F909 Attention-deficit hyperactivity disorder, unspecified type: Secondary | ICD-10-CM | POA: Insufficient documentation

## 2016-09-01 DIAGNOSIS — Y93G3 Activity, cooking and baking: Secondary | ICD-10-CM | POA: Insufficient documentation

## 2016-09-01 MED ORDER — TRAMADOL HCL 50 MG PO TABS: 50.0000 mg | ORAL_TABLET | Freq: Four times a day (QID) | ORAL | 0 refills | Status: DC | PRN

## 2016-09-01 MED FILL — traMADol HCL 50 MG TABS: 50 | 2 days supply | Qty: 10 | Fill #0

## 2016-09-01 NOTE — ED Provider Notes (Signed)
[facility] DEPT [facility] Provider Note   CSN: [id number] Arrival date & time: 09/01/16  3300     History   Chief Complaint Chief Complaint  Patient presents with  . Burn    HPI [name] is a 41 y.o. female.  HPI   Reports cooking last night and burning right arm on stove-top.  EMS evaluated at the scene, recommended pt seek treatment if she develops worsening pain.  Patient had severe pain today so presented to ED.  Pain severe, worse with palpation. No fevers No other injuries.  Using silver abx ointment from over the counter. Ibuprofen not helping with pain. Hx of fibromyalgia/mastectomy and has low pain threshold.   Past Medical History:  Diagnosis Date  . Acid reflux   . ADHD (attention deficit hyperactivity disorder)   . Anxiety    social anxiety  . Back pain   . Breast cancer, left breast ([place]) 2016   Left DCIS - ER-/PR-  . Breast cyst    left  . Chronic pain of left knee   . DDD (degenerative disc disease), cervical   . Dysmenorrhea   . Fibromyalgia   . Full dentures   . Headache    migraines  . History of anemia    during pregnancy  . History of bronchitis   . History of depression   . HPV (human papilloma virus) infection   . Irregular periods/menstrual cycles   . Panic attacks   . Paresthesia of right leg   . Plantar fasciitis    bilateral  . Pyelonephritis 7/06, 1988   pt denies  . Urinary urgency     Patient Active Problem List   Diagnosis Date Noted  . Open wound of chest wall, complicated 76/22/6333  . DCIS (ductal carcinoma in situ) of breast 04/09/2015  . Genetic testing 03/13/2015  . Breast cancer of upper-outer quadrant of left female breast ([place]) 03/02/2015    Past Surgical History:  Procedure Laterality Date  . BREAST RECONSTRUCTION WITH PLACEMENT OF TISSUE EXPANDER AND FLEX HD (ACELLULAR HYDRATED DERMIS) Bilateral 04/09/2015   Procedure: PLACEMENT OF BILATERAL TISSUE EXPANDER AND  USE OF ADM  (ACELLULAR DERMAL MATRIX);   Surgeon: Crissie Reese, MD;  Location: [facility];  Service: Plastics;  Laterality: Bilateral;  . CRYOTHERAPY    . DENTAL SURGERY    . LATISSIMUS FLAP TO BREAST Right 06/11/2015   Procedure: TRANSPOSITION FLAP RIGHT CHEST  ;  Surgeon: Crissie Reese, MD;  Location: [facility];  Service: Plastics;  Laterality: Right;  . MASTECTOMY W/ SENTINEL NODE BIOPSY Bilateral 04/09/2015   Procedure: LEFT MASTECTOMY WITH SENTINEL LEFT LYMPH NODE BIOPSY AND RIGHT PROPHYLACTIC MASTECTOMY;  Surgeon: Donnie Mesa, MD;  Location: [facility];  Service: General;  Laterality: Bilateral;  . REMOVAL OF TISSUE EXPANDER AND PLACEMENT OF IMPLANT Right 06/11/2015   Procedure: REMOVE AND REPLACE TISSUE EXPANDER RIGHT CHEST ;  Surgeon: Crissie Reese, MD;  Location: [facility];  Service: Plastics;  Laterality: Right;    OB History    Gravida Para Term Preterm AB Living   2 2 2  0 0 2   SAB TAB Ectopic Multiple Live Births   0 0 0 0         Home Medications    Prior to Admission medications   Medication Sig Start Date End Date Taking? Authorizing Provider  acetaminophen (TYLENOL) 500 MG tablet Take 1,000 mg by mouth every 6 (six) hours as needed for moderate pain or headache.    Historical Provider, MD  ALPRAZolam (XANAX) 0.5 MG tablet Take 0.75 mg by mouth at bedtime as needed for anxiety.     Historical Provider, MD  amphetamine-dextroamphetamine (ADDERALL) 20 MG tablet Take 20 mg by mouth 2 (two) times daily as needed.    Historical Provider, MD  Ascorbic Acid (VITAMIN C) 1000 MG tablet Take 1,000 mg by mouth daily.    Historical Provider, MD  doxycycline (VIBRA-TABS) 100 MG tablet Take 1 tablet (100 mg total) by mouth every 12 (twelve) hours. 04/11/15   Crissie Reese, MD  hydrocortisone cream 1 % Apply 1 application topically as needed for itching.    Historical Provider, MD  Magnesium 200 MG TABS Take by mouth.    Historical Provider, MD  Multiple Vitamins-Minerals (ZINC PO) Take by mouth.    Historical Provider, MD  traMADol (ULTRAM) 50  MG tablet Take 1 tablet (50 mg total) by mouth every 6 (six) hours as needed. 09/01/16   Gareth Morgan, MD    Family History Family History  Problem Relation Age of Onset  . Hypertension Father   . COPD Father   . Depression Father   . Emphysema Father   . Heart murmur Mother   . Cleft lip Sister   . Lung cancer Maternal Grandmother     dx in her 49s, smoker  . Diabetes Maternal Grandfather   . Heart Problems Maternal Uncle   . Lung cancer Paternal Grandfather   . Lung cancer Other     MGMs sister  . Melanoma Other     MGMs sister  . Brain cancer Cousin 37    maternal cousin's daughter    Social History Social History  Substance Use Topics  . Smoking status: Former Smoker    Quit date: 03/23/2004  . Smokeless tobacco: Never Used  . Alcohol use No     Allergies   Bee pollen; Erythromycin; Nitrofurantoin monohyd macro; Pyridium [phenazopyridine hcl]; Shellfish allergy; and Zithromax [azithromycin dihydrate]   Review of Systems Review of Systems  Constitutional: Negative for fever.  HENT: Negative for sore throat.   Eyes: Negative for visual disturbance.  Respiratory: Negative for cough and shortness of breath.   Cardiovascular: Negative for chest pain.  Gastrointestinal: Negative for abdominal pain.  Genitourinary: Negative for difficulty urinating.  Musculoskeletal: Negative for back pain.  Skin: Positive for wound. Negative for rash.  Neurological: Negative for syncope and headaches.     Physical Exam Updated Vital Signs BP (!) 142/88 (BP Location: Left Arm)   Pulse 88   Temp 98.2 F (36.8 C)   Resp 20   LMP 08/06/2016   SpO2 99%   Physical Exam  Constitutional: She is oriented to person, place, and time. She appears well-developed and well-nourished. No distress.  HENT:  Head: Normocephalic and atraumatic.  Eyes: Conjunctivae and EOM are normal.  Neck: Normal range of motion.  Cardiovascular: Normal rate, regular rhythm and intact distal pulses.    Pulmonary/Chest: Effort normal and breath sounds normal. No respiratory distress.  Musculoskeletal: She exhibits no edema or tenderness.  Neurological: She is alert and oriented to person, place, and time.  Skin: Skin is warm and dry. No rash noted. She is not diaphoretic. No erythema.  10cm x 2.5cm burn right forearm, ulceration/partial thickness proximal 5cm  Nursing note and vitals reviewed.    ED Treatments / Results  Labs (all labs ordered are listed, but only abnormal results are displayed) Labs Reviewed - No data to display  EKG  EKG Interpretation None  Radiology No results found.  Procedures Procedures (including critical care time)  Medications Ordered in ED Medications - No data to display   Initial Impression / Assessment and Plan / ED Course  I have reviewed the triage vital signs and the nursing notes.  Pertinent labs & imaging results that were available during my care of the patient were reviewed by me and considered in my medical decision making (see chart for details).     41yo female presents with concern for burn to right forearm.  Partial thickness burn on exam. No sign of infection. Discussed keeping clean using, either bacitracin or silver abx ointment as she is doing. Gave short rx for 5 tablets of tramadol, recommend tylenol ibuprofen. Reviewed in drug database, no recent rx. Patient discharged in stable condition with understanding of reasons to return.  Final Clinical Impressions(s) / ED Diagnoses   Final diagnoses:  Partial thickness burn of right forearm, initial encounter    New Prescriptions Discharge Medication List as of 09/01/2016 11:50 AM    START taking these medications   Details  traMADol (ULTRAM) 50 MG tablet Take 1 tablet (50 mg total) by mouth every 6 (six) hours as needed., Starting Thu 09/01/2016, Print         Gareth Morgan, MD 09/01/16 [phone]

## 2016-09-01 NOTE — ED Triage Notes (Signed)
Pt reports burning her right forearm on stove while cooking last night, pt states thpain is intense, she called ems, they placed a light bandage and told her to come to ed pov.

## 2018-04-02 ENCOUNTER — Telehealth: Payer: Self-pay | Admitting: Hematology and Oncology

## 2018-04-02 ENCOUNTER — Encounter: Payer: Self-pay | Admitting: Hematology and Oncology

## 2018-04-02 NOTE — Telephone Encounter (Signed)
Returned call to patient re needing annual f/u appointment. Patient a no show for January 2018 f/u. Rescheduled f/u to 11/26. Not able to reach patient by phone or leave message. Schedule mailed.

## 2018-04-06 ENCOUNTER — Telehealth: Payer: Self-pay | Admitting: Hematology and Oncology

## 2018-04-06 NOTE — Telephone Encounter (Signed)
Tried to call but call could not be completed at the time

## 2018-04-13 ENCOUNTER — Telehealth: Payer: Self-pay | Admitting: Hematology and Oncology

## 2018-04-13 NOTE — Telephone Encounter (Signed)
R/s appt per 11/22 sch message - pt is aware of appt date and time.   

## 2018-04-17 ENCOUNTER — Inpatient Hospital Stay: Payer: BLUE CROSS/BLUE SHIELD | Admitting: Hematology and Oncology

## 2018-05-17 ENCOUNTER — Telehealth: Payer: Self-pay | Admitting: Hematology and Oncology

## 2018-05-17 ENCOUNTER — Inpatient Hospital Stay: Payer: BLUE CROSS/BLUE SHIELD | Attending: Hematology and Oncology | Admitting: Hematology and Oncology

## 2018-05-17 DIAGNOSIS — Z853 Personal history of malignant neoplasm of breast: Secondary | ICD-10-CM

## 2018-05-17 DIAGNOSIS — C50412 Malignant neoplasm of upper-outer quadrant of left female breast: Secondary | ICD-10-CM

## 2018-05-17 DIAGNOSIS — Z79899 Other long term (current) drug therapy: Secondary | ICD-10-CM

## 2018-05-17 DIAGNOSIS — Z9012 Acquired absence of left breast and nipple: Secondary | ICD-10-CM

## 2018-05-17 DIAGNOSIS — Z171 Estrogen receptor negative status [ER-]: Secondary | ICD-10-CM

## 2018-05-17 MED ORDER — BUPROPION HCL ER (XL) 150 MG PO TB24: 150.0000 mg | ORAL_TABLET | Freq: Every day | ORAL | Status: AC

## 2018-05-17 NOTE — Assessment & Plan Note (Signed)
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 Chest exam: No palpable lumps or nodules of concern in the chest wall or axilla.  We discussed survivorship issues. Return to clinic in 1 year

## 2018-05-17 NOTE — Progress Notes (Signed)
 Patient Care Team: Furr, Sara M., MD as PCP - General (Internal Medicine) Gudena, Vinay, MD as Consulting Physician (Hematology and Oncology) Tsuei, Matthew, MD as Consulting Physician (General Surgery) Murray, Robert, MD as Consulting Physician (Radiation Oncology) Mackey, Heather Thompson, NP as Nurse Practitioner (Hematology and Oncology) Bowers, David, MD as Consulting Physician (Plastic Surgery)  DIAGNOSIS:  Encounter Diagnosis  Name Primary?  . Malignant neoplasm of upper-outer quadrant of left breast in female, estrogen receptor negative ([facility])     SUMMARY OF ONCOLOGIC HISTORY:   Breast cancer of upper-outer quadrant of left female breast ([facility])   02/18/2015 Mammogram    Left breast calcs10 x 12.5 x 9.5 cm U/S 4.5 cm mass, prominent left axillary lymph node, dense breast. Right breast: masses in the subareolar region and in UOQ benign appearing calcifications, U/S multiple cysts 1.4 cm    02/18/2015 Initial Biopsy    Left breast biopsies (anterior and posterior) 12:00: DCIS with necrosis and calcifications ER 0%, PR 0%, high-grade    02/18/2015 Clinical Stage    Stage 0: Tis N0    02/26/2015 Breast MRI    Non mass enhancement comprises greater than 50% of the left breast, measuring approximately 10.5 x 9.5 x 8.1 cm, consistent with biopsy-proven extensive high-grade ductal carcinoma in situ.    03/11/2015 Procedure    genetic testing normal    03/13/2015 Procedure    Breast/Ovarian panel reveals no clinically significant variable at ATM, BARD1, BRCA1, BRCA2, BRIP1, CDH1, CHEK2, EPCAM, FANCC, MLH1, MSH2, MSH6, NBN, PALB2, PMS2, PTEN, RAD51C, RAD51D, TP53, and XRCC2    04/09/2015 Definitive Surgery    Bilateral mastectomies with immediate reconstruction/SLNB: left mastectomy: High-grade DCIS with necrosis and calcifications, 11 cm, margins negative,0/1 lymph node negative; right mastectomy benign    04/09/2015 Pathologic Stage    Stage 0: Tis N0     CHIEF COMPLIANT:  Surveillance for breast cancer status post bilateral mastectomies and reconstruction  INTERVAL HISTORY: [name] is a 42-year-old with above-mentioned history of bilateral mastectomies reconstruction for left breast DCIS.  She is currently in surveillance and reports to be doing quite well.  She had complications from reconstruction which have all completely resolved.  Last year she could not come because of problems with insurance.  She denies any lumps or nodules in the reconstructed breasts or axilla.  REVIEW OF SYSTEMS:   Constitutional: Denies fevers, chills or abnormal weight loss Eyes: Denies blurriness of vision Ears, nose, mouth, throat, and face: Denies mucositis or sore throat Respiratory: Denies cough, dyspnea or wheezes Cardiovascular: Denies palpitation, chest discomfort Gastrointestinal:  Denies nausea, heartburn or change in bowel habits Skin: Denies abnormal skin rashes Lymphatics: Denies new lymphadenopathy or easy bruising Neurological:Denies numbness, tingling or new weaknesses Behavioral/Psych: Mood is stable, no new changes  Extremities: No lower extremity edema Breast:  denies any pain or lumps or nodules in either reconstructed breasts All other systems were reviewed with the patient and are negative.  I have reviewed the past medical history, past surgical history, social history and family history with the patient and they are unchanged from previous note.  ALLERGIES:  is allergic to bee pollen; erythromycin; nitrofurantoin monohyd macro; pyridium [phenazopyridine hcl]; shellfish allergy; and zithromax [azithromycin dihydrate].  MEDICATIONS:  Current Outpatient Medications  Medication Sig Dispense Refill  . ALPRAZolam (XANAX) 0.5 MG tablet Take 0.75 mg by mouth at bedtime as needed for anxiety.     . Ascorbic Acid (VITAMIN C) 1000 MG tablet Take 1,000 mg by mouth daily.    .   buPROPion (WELLBUTRIN XL) 150 MG 24 hr tablet Take 1 tablet (150 mg total) by mouth  daily.    . doxycycline (VIBRA-TABS) 100 MG tablet Take 1 tablet (100 mg total) by mouth every 12 (twelve) hours. 30 tablet 0  . Magnesium 200 MG TABS Take by mouth.    . Multiple Vitamins-Minerals (ZINC PO) Take by mouth.     No current facility-administered medications for this visit.     PHYSICAL EXAMINATION: ECOG PERFORMANCE STATUS: 0 - Asymptomatic  Vitals:   05/17/18 1205  BP: 139/86  Pulse: 100  Temp: 98.8 F (37.1 C)  SpO2: 100%   Filed Weights   05/17/18 1205  Weight: 185 lb (83.9 kg)    GENERAL:alert, no distress and comfortable SKIN: skin color, texture, turgor are normal, no rashes or significant lesions EYES: normal, Conjunctiva are pink and non-injected, sclera clear OROPHARYNX:no exudate, no erythema and lips, buccal mucosa, and tongue normal  NECK: supple, thyroid normal size, non-tender, without nodularity LYMPH:  no palpable lymphadenopathy in the cervical, axillary or inguinal LUNGS: clear to auscultation and percussion with normal breathing effort HEART: regular rate & rhythm and no murmurs and no lower extremity edema ABDOMEN:abdomen soft, non-tender and normal bowel sounds MUSCULOSKELETAL:no cyanosis of digits and no clubbing  NEURO: alert & oriented x 3 with fluent speech, no focal motor/sensory deficits EXTREMITIES: No lower extremity edema BREAST: No palpable lumps or nodules in bilateral breasts or axilla. (exam performed in the presence of a chaperone)  LABORATORY DATA:  I have reviewed the data as listed CMP Latest Ref Rng & Units 06/11/2015 04/11/2015 04/10/2015  Glucose 65 - 99 mg/dL 98 122(H) 113(H)  BUN 6 - 20 mg/dL 13 <5(L) 7  Creatinine 0.44 - 1.00 mg/dL 0.69 0.68 0.82  Sodium 135 - 145 mmol/L 139 133(L) 136  Potassium 3.5 - 5.1 mmol/L 3.9 3.7 4.0  Chloride 101 - 111 mmol/L 105 101 105  CO2 22 - 32 mmol/L 25 26 26  Calcium 8.9 - 10.3 mg/dL 9.2 8.1(L) 8.2(L)    Lab Results  Component Value Date   WBC 7.2 06/11/2015   HGB 13.2  06/11/2015   HCT 40.7 06/11/2015   MCV 90.4 06/11/2015   PLT 271 06/11/2015   NEUTROABS 4.3 08/30/2012    ASSESSMENT & PLAN:  Breast cancer of upper-outer quadrant of left female breast ([facility]) Bilateral mastectomies with immediate reconstruction 04/09/2015: left mastectomy: High-grade DCIS with necrosis and calcifications, 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 Chest exam: No palpable lumps or nodules of concern in the chest wall or axilla.  We discussed survivorship issues. Return to clinic in 1 year  No orders of the defined types were placed in this encounter.  The patient has a good understanding of the overall plan. she agrees with it. she will call with any problems that may develop before the next visit here.   Viinay K Gudena, MD 05/17/18    

## 2018-05-17 NOTE — Telephone Encounter (Signed)
Scheduled appt per 12/26 los - gave patient AVS and calender per los.   

## 2019-05-20 NOTE — Progress Notes (Signed)
Patient Care Team: Karleen Hampshire., MD as PCP - General (Internal Medicine) Nicholas Lose, MD as Consulting Physician (Hematology and Oncology) Donnie Mesa, MD as Consulting Physician (General Surgery) Arloa Koh, MD (Inactive) as Consulting Physician (Radiation Oncology) Sylvan Cheese, NP as Nurse Practitioner (Hematology and Oncology) Crissie Reese, MD as Consulting Physician (Plastic Surgery)  DIAGNOSIS:    ICD-10-CM   1. Ductal carcinoma in situ (DCIS) of left breast  D05.12     SUMMARY OF ONCOLOGIC HISTORY: Oncology History  Ductal carcinoma in situ (DCIS) of left breast  02/18/2015 Mammogram   Left breast calcs10 x 12.5 x 9.5 cm U/S 4.5 cm mass, prominent left axillary lymph node, dense breast. Right breast: masses in the subareolar region and in UOQ benign appearing calcifications, U/S multiple cysts 1.4 cm   02/18/2015 Initial Biopsy   Left breast biopsies (anterior and posterior) 12:00: DCIS with necrosis and calcifications ER 0%, PR 0%, high-grade   02/18/2015 Clinical Stage   Stage 0: Tis N0   02/26/2015 Breast MRI   Non mass enhancement comprises greater than 50% of the left breast, measuring approximately 10.5 x 9.5 x 8.1 cm, consistent with biopsy-proven extensive high-grade ductal carcinoma in situ.   03/11/2015 Procedure   genetic testing normal   03/13/2015 Procedure   Breast/Ovarian panel reveals no clinically significant variable at ATM, BARD1, BRCA1, BRCA2, BRIP1, CDH1, CHEK2, EPCAM, FANCC, MLH1, MSH2, MSH6, NBN, PALB2, PMS2, PTEN, RAD51C, RAD51D, TP53, and XRCC2   04/09/2015 Definitive Surgery   Bilateral mastectomies with immediate reconstruction/SLNB: left mastectomy: High-grade DCIS with necrosis and calcifications, 11 cm, margins negative,0/1 lymph node negative; right mastectomy benign   04/09/2015 Pathologic Stage   Stage 0: Tis N0     CHIEF COMPLIANT: Surveillance of left breast DCIS  INTERVAL HISTORY: [name] is a 43 y.o.  with above-mentioned history of left breast DCIS who underwent bilateral mastectomies with reconstruction and who is currently on surveillance. She presents to the clinic today for follow-up.   REVIEW OF SYSTEMS:   Constitutional: Denies fevers, chills or abnormal weight loss Eyes: Denies blurriness of vision Ears, nose, mouth, throat, and face: Denies mucositis or sore throat Respiratory: Denies cough, dyspnea or wheezes Cardiovascular: Denies palpitation, chest discomfort Gastrointestinal: Denies nausea, heartburn or change in bowel habits Skin: Denies abnormal skin rashes Lymphatics: Denies new lymphadenopathy or easy bruising Neurological: Denies numbness, tingling or new weaknesses Behavioral/Psych: Mood is stable, no new changes  Extremities: No lower extremity edema Breast: denies any pain or lumps or nodules in either breasts All other systems were reviewed with the patient and are negative.  I have reviewed the past medical history, past surgical history, social history and family history with the patient and they are unchanged from previous note.  ALLERGIES:  is allergic to bee pollen; erythromycin; nitrofurantoin monohyd macro; pyridium [phenazopyridine hcl]; shellfish allergy; and zithromax [azithromycin dihydrate].  MEDICATIONS:  Current Outpatient Medications  Medication Sig Dispense Refill  . ALPRAZolam (XANAX) 0.5 MG tablet Take 0.75 mg by mouth at bedtime as needed for anxiety.     . Ascorbic Acid (VITAMIN C) 1000 MG tablet Take 1,000 mg by mouth daily.    Marland Kitchen buPROPion (WELLBUTRIN XL) 150 MG 24 hr tablet Take 1 tablet (150 mg total) by mouth daily.    Marland Kitchen doxycycline (VIBRA-TABS) 100 MG tablet Take 1 tablet (100 mg total) by mouth every 12 (twelve) hours. 30 tablet 0  . Magnesium 200 MG TABS Take by mouth.    . Multiple Vitamins-Minerals (  ZINC PO) Take by mouth.     No current facility-administered medications for this visit.    PHYSICAL EXAMINATION: ECOG PERFORMANCE  STATUS: 1 - Symptomatic but completely ambulatory  Vitals:   05/21/19 1136  BP: (!) 141/77  Pulse: (!) 108  Resp: 19  Temp: 97.6 F (36.4 C)  SpO2: 100%   Filed Weights   05/21/19 1136  Weight: 167 lb 8 oz (76 kg)    GENERAL: alert, no distress and comfortable SKIN: skin color, texture, turgor are normal, no rashes or significant lesions EYES: normal, Conjunctiva are pink and non-injected, sclera clear OROPHARYNX: no exudate, no erythema and lips, buccal mucosa, and tongue normal  NECK: supple, thyroid normal size, non-tender, without nodularity LYMPH: no palpable lymphadenopathy in the cervical, axillary or inguinal LUNGS: clear to auscultation and percussion with normal breathing effort HEART: regular rate & rhythm and no murmurs and no lower extremity edema ABDOMEN: abdomen soft, non-tender and normal bowel sounds MUSCULOSKELETAL: no cyanosis of digits and no clubbing  NEURO: alert & oriented x 3 with fluent speech, no focal motor/sensory deficits EXTREMITIES: No lower extremity edema BREAST: No palpable masses or nodules in either right or left breasts. No palpable axillary supraclavicular or infraclavicular adenopathy no breast tenderness or nipple discharge. (exam performed in the presence of a chaperone)  LABORATORY DATA:  I have reviewed the data as listed CMP Latest Ref Rng & Units 06/11/2015 04/11/2015 04/10/2015  Glucose 65 - 99 mg/dL 98 122(H) 113(H)  BUN 6 - 20 mg/dL 13 <5(L) 7  Creatinine 0.44 - 1.00 mg/dL 0.69 0.68 0.82  Sodium 135 - 145 mmol/L 139 133(L) 136  Potassium 3.5 - 5.1 mmol/L 3.9 3.7 4.0  Chloride 101 - 111 mmol/L 105 101 105  CO2 22 - 32 mmol/L 25 26 26   Calcium 8.9 - 10.3 mg/dL 9.2 8.1(L) 8.2(L)    Lab Results  Component Value Date   WBC 7.2 06/11/2015   HGB 13.2 06/11/2015   HCT 40.7 06/11/2015   MCV 90.4 06/11/2015   PLT 271 06/11/2015   NEUTROABS 4.3 08/30/2012    ASSESSMENT & PLAN:  Ductal carcinoma in situ (DCIS) of left breast  Bilateral mastectomies with immediate reconstruction 04/09/2015: left mastectomy: High-grade DCIS with necrosis and calcifications, 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 Chest exam 05/21/2019: No palpable lumps or nodules of concern in the chest wall or axilla.  Patient's dog passed away and she has been depressed about it and has gained a lot of weight. I discussed with her about losing weight when she comes back to see me by next year. Return to clinic in 1 year    No orders of the defined types were placed in this encounter.  The patient has a good understanding of the overall plan. she agrees with it. she will call with any problems that may develop before the next visit here.  Nicholas Lose, MD 05/21/2019  Julious Oka Dorshimer, am acting as scribe for Dr. Nicholas Lose.  I have reviewed the above document for accuracy and completeness, and I agree with the above.

## 2019-05-21 ENCOUNTER — Other Ambulatory Visit: Payer: Self-pay

## 2019-05-21 ENCOUNTER — Inpatient Hospital Stay: Payer: BLUE CROSS/BLUE SHIELD | Attending: Hematology and Oncology | Admitting: Hematology and Oncology

## 2019-05-21 DIAGNOSIS — Z79899 Other long term (current) drug therapy: Secondary | ICD-10-CM | POA: Insufficient documentation

## 2019-05-21 DIAGNOSIS — R635 Abnormal weight gain: Secondary | ICD-10-CM | POA: Insufficient documentation

## 2019-05-21 DIAGNOSIS — Z9013 Acquired absence of bilateral breasts and nipples: Secondary | ICD-10-CM | POA: Insufficient documentation

## 2019-05-21 DIAGNOSIS — Z86000 Personal history of in-situ neoplasm of breast: Secondary | ICD-10-CM | POA: Insufficient documentation

## 2019-05-21 DIAGNOSIS — D0512 Intraductal carcinoma in situ of left breast: Secondary | ICD-10-CM | POA: Diagnosis not present

## 2019-05-21 DIAGNOSIS — Z171 Estrogen receptor negative status [ER-]: Secondary | ICD-10-CM | POA: Diagnosis not present

## 2019-05-21 DIAGNOSIS — F4321 Adjustment disorder with depressed mood: Secondary | ICD-10-CM | POA: Diagnosis not present

## 2019-05-21 NOTE — Assessment & Plan Note (Signed)
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 Chest exam 05/21/2019: No palpable lumps or nodules of concern in the chest wall or axilla.    Return to clinic in 1 year

## 2020-05-04 ENCOUNTER — Telehealth: Payer: Self-pay | Admitting: Hematology and Oncology

## 2020-05-04 NOTE — Telephone Encounter (Signed)
Rescheduled appt per provider PAL. Left voicemail with cancellation details and new appt date and time.

## 2020-05-21 ENCOUNTER — Ambulatory Visit: Payer: BLUE CROSS/BLUE SHIELD | Admitting: Hematology and Oncology

## 2020-05-31 NOTE — Progress Notes (Signed)
Patient Care Team: Karleen Hampshire., MD as PCP - General (Internal Medicine) Nicholas Lose, MD as Consulting Physician (Hematology and Oncology) Donnie Mesa, MD as Consulting Physician (General Surgery) Arloa Koh, MD (Inactive) as Consulting Physician (Radiation Oncology) Sylvan Cheese, NP as Nurse Practitioner (Hematology and Oncology) Crissie Reese, MD as Consulting Physician (Plastic Surgery)  DIAGNOSIS:    ICD-10-CM   1. Ductal carcinoma in situ (DCIS) of left breast  D05.12     SUMMARY OF ONCOLOGIC HISTORY: Oncology History  Ductal carcinoma in situ (DCIS) of left breast  02/18/2015 Mammogram   Left breast calcs10 x 12.5 x 9.5 cm U/S 4.5 cm mass, prominent left axillary lymph node, dense breast. Right breast: masses in the subareolar region and in UOQ benign appearing calcifications, U/S multiple cysts 1.4 cm   02/18/2015 Initial Biopsy   Left breast biopsies (anterior and posterior) 12:00: DCIS with necrosis and calcifications ER 0%, PR 0%, high-grade   02/18/2015 Clinical Stage   Stage 0: Tis N0   02/26/2015 Breast MRI   Non mass enhancement comprises greater than 50% of the left breast, measuring approximately 10.5 x 9.5 x 8.1 cm, consistent with biopsy-proven extensive high-grade ductal carcinoma in situ.   03/11/2015 Procedure   genetic testing normal   03/13/2015 Procedure   Breast/Ovarian panel reveals no clinically significant variable at ATM, BARD1, BRCA1, BRCA2, BRIP1, CDH1, CHEK2, EPCAM, FANCC, MLH1, MSH2, MSH6, NBN, PALB2, PMS2, PTEN, RAD51C, RAD51D, TP53, and XRCC2   04/09/2015 Definitive Surgery   Bilateral mastectomies with immediate reconstruction/SLNB: left mastectomy: High-grade DCIS with necrosis and calcifications, 11 cm, margins negative,0/1 lymph node negative; right mastectomy benign   04/09/2015 Pathologic Stage   Stage 0: Tis N0     CHIEF COMPLIANT: Surveillance of left breast DCIS  INTERVAL HISTORY: [name] is a 45 y.o.  with above-mentioned history of left breast DCIS who underwent bilateral mastectomies with reconstruction and who is currently on surveillance. She presents to the clinic today for follow-up.   She has profound fibromyalgia related pain for which she is seeing a pain physician and is being treated with tramadol.  She also has anxiety but she was taken off Xanax because of the fact that she is on tramadol.  She takes Flexeril as needed. She complains of soreness in the breast and axilla.  ALLERGIES:  is allergic to bee pollen, erythromycin, nitrofurantoin monohyd macro, pyridium [phenazopyridine hcl], shellfish allergy, and zithromax [azithromycin dihydrate].  MEDICATIONS:  Current Outpatient Medications  Medication Sig Dispense Refill  . ALPRAZolam (XANAX) 0.5 MG tablet Take 0.75 mg by mouth at bedtime as needed for anxiety.     . Ascorbic Acid (VITAMIN C) 1000 MG tablet Take 1,000 mg by mouth daily.    Marland Kitchen buPROPion (WELLBUTRIN XL) 150 MG 24 hr tablet Take 1 tablet (150 mg total) by mouth daily.    Marland Kitchen doxycycline (VIBRA-TABS) 100 MG tablet Take 1 tablet (100 mg total) by mouth every 12 (twelve) hours. 30 tablet 0  . Magnesium 200 MG TABS Take by mouth.    . Multiple Vitamins-Minerals (ZINC PO) Take by mouth.     No current facility-administered medications for this visit.    PHYSICAL EXAMINATION: ECOG PERFORMANCE STATUS: 1 - Symptomatic but completely ambulatory  Vitals:   06/01/20 1131  BP: 136/81  Pulse: (!) 111  Resp: 20  Temp: 98.1 F (36.7 C)  SpO2: 99%   Filed Weights   06/01/20 1131  Weight: 177 lb 14.4 oz (80.7 kg)  BREAST: Bilateral mastectomies with reconstruction (exam performed in the presence of a chaperone)  LABORATORY DATA:  I have reviewed the data as listed CMP Latest Ref Rng & Units 06/11/2015 04/11/2015 04/10/2015  Glucose 65 - 99 mg/dL 98 122(H) 113(H)  BUN 6 - 20 mg/dL 13 <5(L) 7  Creatinine 0.44 - 1.00 mg/dL 0.69 0.68 0.82  Sodium 135 - 145 mmol/L 139  133(L) 136  Potassium 3.5 - 5.1 mmol/L 3.9 3.7 4.0  Chloride 101 - 111 mmol/L 105 101 105  CO2 22 - 32 mmol/L 25 26 26   Calcium 8.9 - 10.3 mg/dL 9.2 8.1(L) 8.2(L)    Lab Results  Component Value Date   WBC 7.2 06/11/2015   HGB 13.2 06/11/2015   HCT 40.7 06/11/2015   MCV 90.4 06/11/2015   PLT 271 06/11/2015   NEUTROABS 4.3 08/30/2012    ASSESSMENT & PLAN:  Ductal carcinoma in situ (DCIS) of left breast Bilateral mastectomies with immediate reconstruction 04/09/2015: left mastectomy: High-grade DCIS with necrosis and calcifications, 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 Chest exam  06/01/2020: No palpable lumps or nodules of concern in the chest wall or axilla.  Bilateral reconstructed breasts  Weight issues: She cut down her eating and has lost some weight.  She is thinking of starting an exercise regimen. Severe fibromyalgia: Currently under pain management.  Using tramadol.   Return to clinic in 1 year    No orders of the defined types were placed in this encounter.  The patient has a good understanding of the overall plan. she agrees with it. she will call with any problems that may develop before the next visit here.  Total time spent: 20 mins including face to face time and time spent for planning, charting and coordination of care  Nicholas Lose, MD 06/01/2020  I, Cloyde Reams Dorshimer, am acting as scribe for Dr. Nicholas Lose.  I have reviewed the above documentation for accuracy and completeness, and I agree with the above.

## 2020-06-01 ENCOUNTER — Other Ambulatory Visit: Payer: Self-pay

## 2020-06-01 ENCOUNTER — Inpatient Hospital Stay: Payer: BLUE CROSS/BLUE SHIELD | Attending: Hematology and Oncology | Admitting: Hematology and Oncology

## 2020-06-01 DIAGNOSIS — D0512 Intraductal carcinoma in situ of left breast: Secondary | ICD-10-CM

## 2020-06-01 DIAGNOSIS — Z9012 Acquired absence of left breast and nipple: Secondary | ICD-10-CM | POA: Diagnosis not present

## 2020-06-01 DIAGNOSIS — M797 Fibromyalgia: Secondary | ICD-10-CM | POA: Diagnosis not present

## 2020-06-01 DIAGNOSIS — Z86000 Personal history of in-situ neoplasm of breast: Secondary | ICD-10-CM | POA: Diagnosis present

## 2020-06-01 DIAGNOSIS — Z79899 Other long term (current) drug therapy: Secondary | ICD-10-CM | POA: Diagnosis not present

## 2020-06-01 NOTE — Assessment & Plan Note (Signed)
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 Chest exam 05/21/2019: No palpable lumps or nodules of concern in the chest wall or axilla.  Weight issues:   Return to clinic in 1 year

## 2021-05-31 ENCOUNTER — Encounter: Payer: Self-pay | Admitting: Hematology and Oncology

## 2021-06-01 ENCOUNTER — Inpatient Hospital Stay: Payer: BLUE CROSS/BLUE SHIELD | Admitting: Hematology and Oncology

## 2022-05-24 NOTE — Progress Notes (Incomplete)
 Patient Care Team: Furr, Sara M., MD as PCP - General (Internal Medicine) Gudena, Vinay, MD as Consulting Physician (Hematology and Oncology) Tsuei, Matthew, MD as Consulting Physician (General Surgery) Murray, Robert, MD (Inactive) as Consulting Physician (Radiation Oncology) Mackey, Heather Thompson, NP as Nurse Practitioner (Hematology and Oncology) Bowers, David, MD as Consulting Physician (Plastic Surgery)  DIAGNOSIS: No diagnosis found.  SUMMARY OF ONCOLOGIC HISTORY: Oncology History  Ductal carcinoma in situ (DCIS) of left breast  02/18/2015 Mammogram   Left breast calcs10 x 12.5 x 9.5 cm U/S 4.5 cm mass, prominent left axillary lymph node, dense breast. Right breast: masses in the subareolar region and in UOQ benign appearing calcifications, U/S multiple cysts 1.4 cm   02/18/2015 Initial Biopsy   Left breast biopsies (anterior and posterior) 12:00: DCIS with necrosis and calcifications ER 0%, PR 0%, high-grade   02/18/2015 Clinical Stage   Stage 0: Tis N0   02/26/2015 Breast MRI   Non mass enhancement comprises greater than 50% of the left breast, measuring approximately 10.5 x 9.5 x 8.1 cm, consistent with biopsy-proven extensive high-grade ductal carcinoma in situ.   03/11/2015 Procedure   genetic testing normal   03/13/2015 Procedure   Breast/Ovarian panel reveals no clinically significant variable at ATM, BARD1, BRCA1, BRCA2, BRIP1, CDH1, CHEK2, EPCAM, FANCC, MLH1, MSH2, MSH6, NBN, PALB2, PMS2, PTEN, RAD51C, RAD51D, TP53, and XRCC2   04/09/2015 Definitive Surgery   Bilateral mastectomies with immediate reconstruction/SLNB: left mastectomy: High-grade DCIS with necrosis and calcifications, 11 cm, margins negative,0/1 lymph node negative; right mastectomy benign   04/09/2015 Pathologic Stage   Stage 0: Tis N0     CHIEF COMPLIANT: Surveillance of left breast DCIS   INTERVAL HISTORY: [name] is a 47 y.o. with above-mentioned history of left breast DCIS who  underwent bilateral mastectomies with reconstruction and who is currently on surveillance. She presents to the clinic today for follow-up    ALLERGIES:  is allergic to bee pollen, erythromycin, nitrofurantoin monohyd macro, pyridium [phenazopyridine hcl], shellfish allergy, and zithromax [azithromycin dihydrate].  MEDICATIONS:  Current Outpatient Medications  Medication Sig Dispense Refill   ALPRAZolam (XANAX) 0.5 MG tablet Take 0.75 mg by mouth at bedtime as needed for anxiety.      Ascorbic Acid (VITAMIN C) 1000 MG tablet Take 1,000 mg by mouth daily.     buPROPion (WELLBUTRIN XL) 150 MG 24 hr tablet Take 1 tablet (150 mg total) by mouth daily.     doxycycline (VIBRA-TABS) 100 MG tablet Take 1 tablet (100 mg total) by mouth every 12 (twelve) hours. 30 tablet 0   Magnesium 200 MG TABS Take by mouth.     Multiple Vitamins-Minerals (ZINC PO) Take by mouth.     No current facility-administered medications for this visit.    PHYSICAL EXAMINATION: ECOG PERFORMANCE STATUS: {CHL ONC ECOG [id number]}  There were no vitals filed for this visit. There were no vitals filed for this visit.  BREAST:*** No palpable masses or nodules in either right or left breasts. No palpable axillary supraclavicular or infraclavicular adenopathy no breast tenderness or nipple discharge. (exam performed in the presence of a chaperone)  LABORATORY DATA:  I have reviewed the data as listed    Latest Ref Rng & Units 06/11/2015    8:50 AM 04/11/2015    4:04 AM 04/10/2015    7:04 AM  CMP  Glucose 65 - 99 mg/dL 98  122  113   BUN 6 - 20 mg/dL 13  <5  7   Creatinine 0.44 -   1.00 mg/dL 0.69  0.68  0.82   Sodium 135 - 145 mmol/L 139  133  136   Potassium 3.5 - 5.1 mmol/L 3.9  3.7  4.0   Chloride 101 - 111 mmol/L 105  101  105   CO2 22 - 32 mmol/L _0 Calcium 8.9 - 10.3 mg/dL 9.2  8.1  8.2     Lab Results  Component Value Date   WBC 7.2 06/11/2015   HGB 13.2 06/11/2015   HCT 40.7 06/11/2015   MCV  90.4 06/11/2015   PLT 271 06/11/2015   NEUTROABS 4.3 08/30/2012    ASSESSMENT & PLAN:  No problem-specific Assessment & Plan notes found for this encounter.    No orders of the defined types were placed in this encounter.  The patient has a good understanding of the overall plan. she agrees with it. she will call with any problems that may develop before the next visit here. Total time spent: 30 mins including face to face time and time spent for planning, charting and co-ordination of care   Suzzette Righter, [place] 05/24/22    I Gardiner Coins am acting as a Education administrator for [employer]  ***

## 2022-05-31 NOTE — Assessment & Plan Note (Deleted)
Bilateral mastectomies with immediate reconstruction 04/09/2015: left mastectomy: High-grade DCIS with necrosis and calcifications, 11 cm, margins negative,0/1 lymph node negative; right mastectomy benign Tis N0 stage 0, ER 0%, PR 0%   Recommendation: Surveillance with periodic chest exams and axillary exams. Chest exam  06/01/2022: No palpable lumps or nodules of concern in the chest wall or axilla.  Bilateral reconstructed breasts   Weight issues: She cut down her eating and has lost some weight.  She is thinking of starting an exercise regimen. Severe fibromyalgia: Currently under pain management.  Using tramadol.    Return to clinic in 1 year

## 2022-06-01 ENCOUNTER — Inpatient Hospital Stay: Payer: BLUE CROSS/BLUE SHIELD | Attending: Hematology and Oncology | Admitting: Hematology and Oncology

## 2022-06-01 DIAGNOSIS — D0512 Intraductal carcinoma in situ of left breast: Secondary | ICD-10-CM
